# Patient Record
Sex: Male | Born: 1955 | Race: White | Hispanic: No | Marital: Married | Smoking: Former smoker
Health system: Southern US, Community
[De-identification: ages and names within clinical notes are randomized; demographics above are authoritative.]

## PROBLEM LIST (undated history)

## (undated) DIAGNOSIS — I10 Essential (primary) hypertension: Secondary | ICD-10-CM

## (undated) DIAGNOSIS — T8859XA Other complications of anesthesia, initial encounter: Secondary | ICD-10-CM

## (undated) DIAGNOSIS — E785 Hyperlipidemia, unspecified: Secondary | ICD-10-CM

## (undated) DIAGNOSIS — K219 Gastro-esophageal reflux disease without esophagitis: Secondary | ICD-10-CM

## (undated) DIAGNOSIS — J302 Other seasonal allergic rhinitis: Secondary | ICD-10-CM

## (undated) DIAGNOSIS — R7303 Prediabetes: Secondary | ICD-10-CM

## (undated) DIAGNOSIS — T4145XA Adverse effect of unspecified anesthetic, initial encounter: Secondary | ICD-10-CM

## (undated) HISTORY — PX: COLONOSCOPY W/ BIOPSIES AND POLYPECTOMY: SHX1376

## (undated) HISTORY — PX: FRACTURE SURGERY: SHX138

---
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
## 2013-06-10 ENCOUNTER — Inpatient Hospital Stay (HOSPITAL_COMMUNITY): Payer: BC Managed Care – PPO

## 2013-06-10 ENCOUNTER — Inpatient Hospital Stay (HOSPITAL_COMMUNITY)
Admission: AD | Admit: 2013-06-10 | Discharge: 2013-06-15 | DRG: 871 | Disposition: A | Discharge status: Home Health | Payer: BC Managed Care – PPO | Source: Ambulatory Visit | Attending: Internal Medicine | Admitting: Internal Medicine

## 2013-06-10 ENCOUNTER — Encounter (HOSPITAL_COMMUNITY): Payer: Self-pay | Admitting: Internal Medicine

## 2013-06-10 DIAGNOSIS — Z87891 Personal history of nicotine dependence: Secondary | ICD-10-CM

## 2013-06-10 DIAGNOSIS — Z833 Family history of diabetes mellitus: Secondary | ICD-10-CM

## 2013-06-10 DIAGNOSIS — I10 Essential (primary) hypertension: Secondary | ICD-10-CM

## 2013-06-10 DIAGNOSIS — K819 Cholecystitis, unspecified: Secondary | ICD-10-CM

## 2013-06-10 DIAGNOSIS — Z823 Family history of stroke: Secondary | ICD-10-CM

## 2013-06-10 DIAGNOSIS — K219 Gastro-esophageal reflux disease without esophagitis: Secondary | ICD-10-CM | POA: Diagnosis present

## 2013-06-10 DIAGNOSIS — K8000 Calculus of gallbladder with acute cholecystitis without obstruction: Secondary | ICD-10-CM | POA: Diagnosis present

## 2013-06-10 DIAGNOSIS — R7309 Other abnormal glucose: Secondary | ICD-10-CM | POA: Diagnosis present

## 2013-06-10 DIAGNOSIS — E785 Hyperlipidemia, unspecified: Secondary | ICD-10-CM

## 2013-06-10 DIAGNOSIS — I498 Other specified cardiac arrhythmias: Secondary | ICD-10-CM | POA: Diagnosis present

## 2013-06-10 DIAGNOSIS — K750 Abscess of liver: Secondary | ICD-10-CM

## 2013-06-10 DIAGNOSIS — A419 Sepsis, unspecified organism: Principal | ICD-10-CM

## 2013-06-10 DIAGNOSIS — E876 Hypokalemia: Secondary | ICD-10-CM | POA: Diagnosis present

## 2013-06-10 DIAGNOSIS — R000 Tachycardia, unspecified: Secondary | ICD-10-CM | POA: Diagnosis present

## 2013-06-10 HISTORY — DX: Essential (primary) hypertension: I10

## 2013-06-10 HISTORY — DX: Prediabetes: R73.03

## 2013-06-10 HISTORY — DX: Hyperlipidemia, unspecified: E78.5

## 2013-06-10 MED ORDER — HEPARIN SODIUM (PORCINE) 5000 UNIT/ML IJ SOLN: 5000.0000 [IU] | Freq: Three times a day (TID) | INTRAMUSCULAR | Status: DC

## 2013-06-10 MED ORDER — INSULIN ASPART 100 UNIT/ML ~~LOC~~ SOLN: 0.0000 [IU] | Freq: Four times a day (QID) | SUBCUTANEOUS | Status: DC

## 2013-06-10 MED ORDER — SODIUM CHLORIDE 0.9 % IJ SOLN
3.0000 mL | Freq: Two times a day (BID) | INTRAMUSCULAR | Status: DC
  Administered 2013-06-12 – 2013-06-14 (×3): 3 mL via INTRAVENOUS

## 2013-06-10 MED ORDER — SODIUM CHLORIDE 0.9 % IV SOLN
INTRAVENOUS | Status: DC
  Administered 2013-06-10 – 2013-06-13 (×6): via INTRAVENOUS

## 2013-06-10 MED ORDER — DEXTROSE 5 % IV SOLN
1.0000 g | INTRAVENOUS | Status: DC
  Administered 2013-06-10 – 2013-06-13 (×4): 1 g via INTRAVENOUS
  Filled 2013-06-10 (×5): qty 10

## 2013-06-10 MED ORDER — SODIUM CHLORIDE 0.9 % IV BOLUS (SEPSIS)
1000.0000 mL | Freq: Once | INTRAVENOUS | Status: AC
  Administered 2013-06-10: 1000 mL via INTRAVENOUS

## 2013-06-10 MED ORDER — ONDANSETRON HCL 4 MG/2ML IJ SOLN: 4.0000 mg | Freq: Four times a day (QID) | INTRAMUSCULAR | Status: DC | PRN

## 2013-06-10 MED ORDER — METRONIDAZOLE IN NACL 5-0.79 MG/ML-% IV SOLN
500.0000 mg | Freq: Three times a day (TID) | INTRAVENOUS | Status: DC
  Administered 2013-06-10 – 2013-06-14 (×11): 500 mg via INTRAVENOUS
  Filled 2013-06-10 (×13): qty 100

## 2013-06-10 MED ORDER — ONDANSETRON HCL 4 MG PO TABS: 4.0000 mg | ORAL_TABLET | Freq: Four times a day (QID) | ORAL | Status: DC | PRN

## 2013-06-10 MED ORDER — MORPHINE SULFATE 2 MG/ML IJ SOLN
2.0000 mg | INTRAMUSCULAR | Status: DC | PRN
  Administered 2013-06-11 – 2013-06-12 (×3): 2 mg via INTRAVENOUS
  Filled 2013-06-10 (×3): qty 1

## 2013-06-10 MED ORDER — PANTOPRAZOLE SODIUM 40 MG IV SOLR
40.0000 mg | INTRAVENOUS | Status: DC
  Administered 2013-06-10: 40 mg via INTRAVENOUS
  Filled 2013-06-10: qty 40

## 2013-06-10 NOTE — H&P (Signed)
[facility] History and Physical  Patient: Vincent Rios  ZOX:[id number]  DOB: [birth year]  DOS: the patient was seen and examined on 06/10/2013 PCP: Kirstie Peri, MD  Chief Complaint: Abdominal pain  HPI: Vincent Rios is a 58 y.o. male with Past medical history of Hypertension and dyslipidemia, Former smoker. The patient presented with complaints of right upper quadrant pain ongoing since last 2 days. He started having this pain during the day and progressively got worse. He initially felt this could be indigestion but then the pain was not improving and he started having episodes of nausea and fever and chills. He started having generalized weakness and tiredness. Earlier on Thursday morning the symptoms were worsening and he had one episode of black color bowel movement. This is he decided to go to his doctor. They performed initial workup which showed leukocytosis. He was sent for an ultrasound which showed thickened gallbladder with possible stone and hepatic lesion and was recommended MRI which showed possible hepatic abscess and conformed acute cholecystitis without any acute abnormality and therefore the patient was sent here for further workup. At the time of my evaluation patient mentions his symptoms have improved but not significantly he continues to remain nauseous and has acid reflux. He does not have any fever or chills. He had been passing gas but no bowel movements this morning. He denies any prior episodes or prior abdominal surgeries he denies any recent diarrhea or travel. He denies any change in his medication. He complains of some shortness of breath and he feels as the cause of abdominal pain limiting and taking deep breath. Pt denies any headache, cough, chest pain, palpitation, orthopnea, PND, burning urination, dizziness, pedal edema,  focal neurological deficit.   The patient is coming from home. And at her baseline independent for most of his  ADL.  Review of  Systems: as mentioned in the history of present illness.  A Comprehensive review of the other systems is negative.  Past Medical History  Diagnosis Date  . Hypertension   . Dyslipidemia    No past surgical history on file. Social History:  reports that he quit smoking about 30 years ago. His smoking use included Cigarettes. He has a 15 pack-year smoking history. He does not have any smokeless tobacco history on file. He reports that he drinks alcohol. His drug history is not on file.  No Known Allergies  Family History  Problem Relation Age of Onset  . Cancer Mother     breast  . Diabetes Mother   . Stroke Father     Prior to Admission medications   Medication Sig Start Date End Date Taking? Authorizing Provider  Coenzyme Q10 (CO Q-10) 200 MG CAPS Take by mouth.   Yes Historical Provider, MD  losartan (COZAAR) 50 MG tablet Take 100 mg by mouth daily.   Yes Historical Provider, MD  omega-3 acid ethyl esters (LOVAZA) 1 G capsule Take by mouth 2 (two) times daily.   Yes Historical Provider, MD  rosuvastatin (CRESTOR) 10 MG tablet Take 10 mg by mouth daily.   Yes Historical Provider, MD    Physical Exam: Filed Vitals:   06/10/13 1936 06/10/13 2156  BP: 133/93 146/92  Pulse: 146 135  Temp: 99.3 F (37.4 C) 99 F (37.2 C)  TempSrc: Oral Oral  Resp: 18 18  SpO2: 95% 95%    General: Alert, Awake and Oriented to Time, Place and Person. Appear in mild distress Eyes: PERRL ENT: Oral Mucosa clear moist. Neck:  no JVD Cardiovascular: S1 and S2 Present, no Murmur, Peripheral Pulses Present Respiratory: Bilateral Air entry equal and Decreased, Clear to Auscultation,  no Crackles,no wheezes Abdomen: Bowel Sound Present, Soft and Right upper quadrant mild tender, No guarding no rigidity, Murphy positive Skin: no Rash Extremities: no Pedal edema, no calf tenderness Neurologic: Grossly Unremarkable.  Labs on Admission:  CBC:  Recent Labs Lab 06/10/13 2250  WBC 26.3*   NEUTROABS 22.6*  HGB 17.4*  HCT 48.9  MCV 82.9  PLT 191    CMP     Component Value Date/Time   NA 132* 06/10/2013 2250   Radiological Exams on Admission: Dg Chest Port 1 View  06/10/2013   CLINICAL DATA:  Shortness of breath, abdominal pain and distention.  EXAM: PORTABLE CHEST - 1 VIEW  COMPARISON:  None.  FINDINGS: The lungs are well-aerated. Mild bibasilar opacities likely reflect atelectasis. There is no evidence of pleural effusion or pneumothorax.  The cardiomediastinal silhouette is within normal limits. No acute osseous abnormalities are seen.  IMPRESSION: Mild bibasilar airspace opacities likely reflect atelectasis; lungs otherwise clear.   Electronically Signed   By: Roanna RaiderJeffery  Chang M.D.   On: 06/10/2013 21:31    EKG: Independently reviewed. sinus tachycardia.  Assessment/Plan Principal Problem:   Cholecystitis Active Problems:   Hypertension   Dyslipidemia   Hepatic abscess   1. Cholecystitis Hepatic abscess The patient is presenting with complaints of abdominal pain in the right upper quadrant his LFTs are within normal limits. He had an MRI as an outpatient which showed possible hepatic abscess with acute cholecystitis. At present other sinus tachycardia the patient appears hemodynamically stable. Patient was discussed with on-call surgeon, We will be requesting imaging studies from the other facility, And a few not able to get them then patient may require CT scan with contrast of the abdomen tomorrow. We will obtain blood cultures, repeat labs. I would start him on IV ceftriaxone and IV Flagyl which would cover both cholecystitis as well as pyogenic abscess. Patient may require IR guidance drainage of the abscess. Patient is aggressively hydrated, IV pain medications IV Zofran.  2.Hypertension Blood pressure at present stable Continue home antihypertensive medications from tomorrow  3.Black color bowel movement We'll obtain Hemoccult, serial CBC, INR  normal Protonix every 12 hours  Consults: General surgery  DVT Prophylaxis: SCD Nutrition: n.P.O. Except sips of water and ice chips  Code Status: Full  Family Communication: Wife was present at bedside, opportunity was given to ask question and all questions were answered satisfactorily at the time of interview. Disposition: Admitted to inpatient in telemetry unit.  Author: Lynden OxfordPranav Mikeria Valin, MD [facility] Pager: [phone] 06/10/2013, 10:32 PM    If 7PM-7AM, please contact night-coverage [email or web address] Password [email or web address]

## 2013-06-11 ENCOUNTER — Inpatient Hospital Stay (HOSPITAL_COMMUNITY): Payer: BC Managed Care – PPO

## 2013-06-11 ENCOUNTER — Encounter (HOSPITAL_COMMUNITY): Payer: Self-pay | Admitting: Radiology

## 2013-06-11 DIAGNOSIS — K750 Abscess of liver: Secondary | ICD-10-CM

## 2013-06-11 DIAGNOSIS — A419 Sepsis, unspecified organism: Principal | ICD-10-CM

## 2013-06-11 DIAGNOSIS — E785 Hyperlipidemia, unspecified: Secondary | ICD-10-CM

## 2013-06-11 DIAGNOSIS — K819 Cholecystitis, unspecified: Secondary | ICD-10-CM

## 2013-06-11 DIAGNOSIS — R000 Tachycardia, unspecified: Secondary | ICD-10-CM | POA: Diagnosis present

## 2013-06-11 DIAGNOSIS — I498 Other specified cardiac arrhythmias: Secondary | ICD-10-CM

## 2013-06-11 DIAGNOSIS — I10 Essential (primary) hypertension: Secondary | ICD-10-CM

## 2013-06-11 LAB — CBC WITH DIFFERENTIAL/PLATELET
Basophils Absolute: 0 10*3/uL (ref 0.0–0.1)
Basophils Absolute: 0 10*3/uL (ref 0.0–0.1)
Basophils Relative: 0 % (ref 0–1)
Basophils Relative: 0 % (ref 0–1)
Eosinophils Absolute: 0 10*3/uL (ref 0.0–0.7)
Eosinophils Absolute: 0 10*3/uL (ref 0.0–0.7)
Eosinophils Relative: 0 % (ref 0–5)
Eosinophils Relative: 0 % (ref 0–5)
HCT: 48.9 % (ref 39.0–52.0)
HCT: 46.1 % (ref 39.0–52.0)
Hemoglobin: 17.4 g/dL — ABNORMAL HIGH (ref 13.0–17.0)
Hemoglobin: 16.2 g/dL (ref 13.0–17.0)
Lymphocytes Relative: 9 % — ABNORMAL LOW (ref 12–46)
Lymphocytes Relative: 6 % — ABNORMAL LOW (ref 12–46)
Lymphs Abs: 2.4 10*3/uL (ref 0.7–4.0)
Lymphs Abs: 1.4 10*3/uL (ref 0.7–4.0)
MCH: 29.5 pg (ref 26.0–34.0)
MCH: 28.9 pg (ref 26.0–34.0)
MCHC: 35.6 g/dL (ref 30.0–36.0)
MCHC: 35.1 g/dL (ref 30.0–36.0)
MCV: 82.9 fL (ref 78.0–100.0)
MCV: 82.2 fL (ref 78.0–100.0)
Monocytes Absolute: 1.3 10*3/uL — ABNORMAL HIGH (ref 0.1–1.0)
Monocytes Absolute: 1.4 10*3/uL — ABNORMAL HIGH (ref 0.1–1.0)
Monocytes Relative: 5 % (ref 3–12)
Monocytes Relative: 6 % (ref 3–12)
Neutro Abs: 22.6 10*3/uL — ABNORMAL HIGH (ref 1.7–7.7)
Neutro Abs: 19.7 10*3/uL — ABNORMAL HIGH (ref 1.7–7.7)
Neutrophils Relative %: 86 % — ABNORMAL HIGH (ref 43–77)
Neutrophils Relative %: 88 % — ABNORMAL HIGH (ref 43–77)
Platelets: 191 10*3/uL (ref 150–400)
Platelets: 172 10*3/uL (ref 150–400)
RBC: 5.9 MIL/uL — ABNORMAL HIGH (ref 4.22–5.81)
RBC: 5.61 MIL/uL (ref 4.22–5.81)
RDW: 13.7 % (ref 11.5–15.5)
RDW: 13.7 % (ref 11.5–15.5)
WBC: 26.3 10*3/uL — ABNORMAL HIGH (ref 4.0–10.5)
WBC: 22.5 10*3/uL — ABNORMAL HIGH (ref 4.0–10.5)

## 2013-06-11 LAB — COMPREHENSIVE METABOLIC PANEL
ALT: 19 U/L (ref 0–53)
ALT: 17 U/L (ref 0–53)
AST: 22 U/L (ref 0–37)
AST: 17 U/L (ref 0–37)
Albumin: 3.4 g/dL — ABNORMAL LOW (ref 3.5–5.2)
Albumin: 2.9 g/dL — ABNORMAL LOW (ref 3.5–5.2)
Alkaline Phosphatase: 94 U/L (ref 39–117)
Alkaline Phosphatase: 81 U/L (ref 39–117)
BUN: 15 mg/dL (ref 6–23)
BUN: 15 mg/dL (ref 6–23)
CO2: 21 mEq/L (ref 19–32)
CO2: 21 mEq/L (ref 19–32)
Calcium: 9.6 mg/dL (ref 8.4–10.5)
Calcium: 9 mg/dL (ref 8.4–10.5)
Chloride: 93 mEq/L — ABNORMAL LOW (ref 96–112)
Chloride: 99 mEq/L (ref 96–112)
Creatinine, Ser: 1.08 mg/dL (ref 0.50–1.35)
Creatinine, Ser: 1.02 mg/dL (ref 0.50–1.35)
GFR calc Af Amer: 86 mL/min — ABNORMAL LOW (ref >90)
GFR calc Af Amer: >90 mL/min (ref >90)
GFR calc non Af Amer: 74 mL/min — ABNORMAL LOW (ref >90)
GFR calc non Af Amer: 79 mL/min — ABNORMAL LOW (ref >90)
Glucose, Bld: 134 mg/dL — ABNORMAL HIGH (ref 70–99)
Glucose, Bld: 155 mg/dL — ABNORMAL HIGH (ref 70–99)
Potassium: 3.8 mEq/L (ref 3.7–5.3)
Potassium: 3.7 mEq/L (ref 3.7–5.3)
Sodium: 132 mEq/L — ABNORMAL LOW (ref 137–147)
Sodium: 136 mEq/L — ABNORMAL LOW (ref 137–147)
Total Bilirubin: 2.3 mg/dL — ABNORMAL HIGH (ref 0.3–1.2)
Total Bilirubin: 1.7 mg/dL — ABNORMAL HIGH (ref 0.3–1.2)
Total Protein: 7.7 g/dL (ref 6.0–8.3)
Total Protein: 6.8 g/dL (ref 6.0–8.3)

## 2013-06-11 LAB — TYPE AND SCREEN
ABO/RH(D): O POS
Antibody Screen: NEGATIVE

## 2013-06-11 LAB — MAGNESIUM: Magnesium: 1.8 mg/dL (ref 1.5–2.5)

## 2013-06-11 LAB — PHOSPHORUS: Phosphorus: 2.2 mg/dL — ABNORMAL LOW (ref 2.3–4.6)

## 2013-06-11 LAB — ABO/RH: ABO/RH(D): O POS

## 2013-06-11 LAB — PROTIME-INR
INR: 1.17 (ref 0.00–1.49)
INR: 1.3 (ref 0.00–1.49)
Prothrombin Time: 14.7 seconds (ref 11.6–15.2)
Prothrombin Time: 15.9 seconds — ABNORMAL HIGH (ref 11.6–15.2)

## 2013-06-11 LAB — APTT: aPTT: 34 seconds (ref 24–37)

## 2013-06-11 MED ORDER — PANTOPRAZOLE SODIUM 40 MG IV SOLR
40.0000 mg | Freq: Two times a day (BID) | INTRAVENOUS | Status: DC
  Administered 2013-06-11 – 2013-06-13 (×5): 40 mg via INTRAVENOUS
  Filled 2013-06-11 (×7): qty 40

## 2013-06-11 MED ORDER — FENTANYL CITRATE 0.05 MG/ML IJ SOLN
INTRAMUSCULAR | Status: AC
  Filled 2013-06-11: qty 4

## 2013-06-11 MED ORDER — FENTANYL CITRATE 0.05 MG/ML IJ SOLN
INTRAMUSCULAR | Status: AC | PRN
  Administered 2013-06-11 (×3): 50 ug via INTRAVENOUS

## 2013-06-11 MED ORDER — MIDAZOLAM HCL 2 MG/2ML IJ SOLN
INTRAMUSCULAR | Status: AC | PRN
  Administered 2013-06-11 (×2): 2 mg via INTRAVENOUS

## 2013-06-11 MED ORDER — FENTANYL CITRATE 0.05 MG/ML IJ SOLN
INTRAMUSCULAR | Status: AC
  Filled 2013-06-11: qty 2

## 2013-06-11 MED ORDER — CHLORHEXIDINE GLUCONATE 0.12 % MT SOLN
15.0000 mL | Freq: Two times a day (BID) | OROMUCOSAL | Status: DC
  Administered 2013-06-11 – 2013-06-13 (×5): 15 mL via OROMUCOSAL
  Filled 2013-06-11 (×4): qty 15

## 2013-06-11 MED ORDER — MIDAZOLAM HCL 2 MG/2ML IJ SOLN
INTRAMUSCULAR | Status: AC
  Filled 2013-06-11: qty 2

## 2013-06-11 MED ORDER — BIOTENE DRY MOUTH MT LIQD
15.0000 mL | Freq: Two times a day (BID) | OROMUCOSAL | Status: DC
  Administered 2013-06-11: 15 mL via OROMUCOSAL

## 2013-06-11 MED ORDER — SODIUM CHLORIDE 0.9 % IV BOLUS (SEPSIS)
500.0000 mL | Freq: Once | INTRAVENOUS | Status: AC
  Administered 2013-06-11: 500 mL via INTRAVENOUS

## 2013-06-11 MED ORDER — MIDAZOLAM HCL 2 MG/2ML IJ SOLN
INTRAMUSCULAR | Status: AC
  Filled 2013-06-11: qty 4

## 2013-06-11 MED ORDER — IOHEXOL 300 MG/ML  SOLN
50.0000 mL | Freq: Once | INTRAMUSCULAR | Status: AC | PRN
  Administered 2013-06-11: 10 mL

## 2013-06-11 NOTE — Progress Notes (Signed)
Chart reviewed.  Discussed with IR PA.   [facility] PROGRESS NOTE  Vincent Rios ZOX:[id number] DOB: 10/28/1955 DOA: 06/10/2013 PCP: Kirstie PeriSHAH,ASHISH, MD  Assessment/Plan:  Principal Problem:  acute Cholecystitis: for perc drain in IR today. Continue NPO, abx Active Problems:   Hypertension   Dyslipidemia   Hepatic abscess? May need drainage or further imaging. Defer to surgery and IR   Sepsis: no shock, but still fairly tachycardic after 1.3 liters saline.  Will bolus another 500 cc. Antihypertensives held   Sinus tachycardia secondary to above. Continue tele for now Reported dark stool: No further stools here and hemoglobin is stable. Hemoccult ordered. RN to collect.  Code Status:  full Family Communication:   Disposition Plan:  home  Consultants:  General surgery  IR  Procedures:     Antibiotics:  Ceftriaxone 4/2  Flagyl 4/2  HPI/Subjective: Feels a little better. Symptoms started 2 days ago. No nausea currently. No further dark stools.  Objective: Filed Vitals:   06/11/13 0855  BP:   Pulse:   Temp: 98.2 F (36.8 C)  Resp:     Intake/Output Summary (Last 24 hours) at 06/11/13 1037 Last data filed at 06/11/13 0634  Gross per 24 hour  Intake   1330 ml  Output    670 ml  Net    660 ml   Filed Weights   06/11/13 0500  Weight: 117.028 kg (258 lb)   Tele: sinus tachycardia rate 120  Exam:   General:  Alert, oriented. Diaphoretic.  Cardiovascular: Tachycardic. Regular  Respiratory: Regular rate rhythm without murmurs without rubs  Abdomen: Soft. Right upper quadrant tender.  Ext: No clubbing cyanosis or edema.  Basic Metabolic Panel:  Recent Labs Lab 06/10/13 2250 06/11/13 0450  NA 132* 136*  K 3.8 3.7  CL 93* 99  CO2 21 21  GLUCOSE 134* 155*  BUN 15 15  CREATININE 1.08 1.02  CALCIUM 9.6 9.0  MG 1.8  --   PHOS 2.2*  --    Liver Function Tests:  Recent Labs Lab 06/10/13 2250 06/11/13 0450  AST 22 17  ALT 19 17   ALKPHOS 94 81  BILITOT 2.3* 1.7*  PROT 7.7 6.8  ALBUMIN 3.4* 2.9*   No results found for this basename: LIPASE, AMYLASE,  in the last 168 hours No results found for this basename: AMMONIA,  in the last 168 hours CBC:  Recent Labs Lab 06/10/13 2250 06/11/13 0450  WBC 26.3* 22.5*  NEUTROABS 22.6* 19.7*  HGB 17.4* 16.2  HCT 48.9 46.1  MCV 82.9 82.2  PLT 191 172   Cardiac Enzymes: No results found for this basename: CKTOTAL, CKMB, CKMBINDEX, TROPONINI,  in the last 168 hours BNP (last 3 results) No results found for this basename: PROBNP,  in the last 8760 hours CBG: No results found for this basename: GLUCAP,  in the last 168 hours  No results found for this or any previous visit (from the past 240 hour(s)).   Studies: Dg Chest Port 1 View  06/10/2013   CLINICAL DATA:  Shortness of breath, abdominal pain and distention.  EXAM: PORTABLE CHEST - 1 VIEW  COMPARISON:  None.  FINDINGS: The lungs are well-aerated. Mild bibasilar opacities likely reflect atelectasis. There is no evidence of pleural effusion or pneumothorax.  The cardiomediastinal silhouette is within normal limits. No acute osseous abnormalities are seen.  IMPRESSION: Mild bibasilar airspace opacities likely reflect atelectasis; lungs otherwise clear.   Electronically Signed   By: Beryle BeamsJeffery  Chang M.D.  On: 06/10/2013 21:31    Scheduled Meds: . antiseptic oral rinse  15 mL Mouth Rinse q12n4p  . cefTRIAXone (ROCEPHIN)  IV  1 g Intravenous Q24H  . chlorhexidine  15 mL Mouth Rinse BID  . metronidazole  500 mg Intravenous Q8H  . pantoprazole (PROTONIX) IV  40 mg Intravenous Q12H  . sodium chloride  3 mL Intravenous Q12H   Continuous Infusions: . sodium chloride 125 mL/hr at 06/10/13 2330    Time spent: 35 minutes  Jaylean Buenaventura L  [facility] Pager [phone]. If 7PM-7AM, please contact night-coverage at [email or web address], password [facility] 06/11/2013, 10:37 AM  LOS: 1 day

## 2013-06-11 NOTE — H&P (Signed)
Chief Complaint: "Abdominal pain and fevers." Referring Physician: CCS HPI: Vincent Rios is an 58 y.o. male who presented with c/o RUQ abdominal pain x 2 days and fever. Recent imaging revealed acute cholecystitis, images of [place] and MRI were reviewed today by Dr. Annamaria Boots and IR received request for image guided percutaneous cholecystostomy tube placement. He denies any chest pain, shortness of breath or palpitations. He denies any active signs of bleeding or any further black stool or excessive bruising. He admits to fever and chills. The patient denies any history of sleep apnea or chronic oxygen use. He has previously tolerated sedation without complications.   Past Medical History:  Past Medical History  Diagnosis Date  . Hypertension   . Dyslipidemia   . Borderline diabetes     Past Surgical History:  Past Surgical History  Procedure Laterality Date  . Tonsillectomy  1962    Family History:  Family History  Problem Relation Age of Onset  . Cancer Mother     breast  . Diabetes Mother   . Stroke Father     Social History:  reports that he quit smoking about 30 years ago. His smoking use included Cigarettes. He has a 15 pack-year smoking history. He has never used smokeless tobacco. He reports that he drinks alcohol. His drug history is not on file.  Allergies: No Known Allergies  Medications:   Medication List    ASK your doctor about these medications       Co Q-10 200 MG Caps  Take 400 mg by mouth daily.     losartan 50 MG tablet  Commonly known as:  COZAAR  Take 100 mg by mouth daily.     omega-3 acid ethyl esters 1 G capsule  Commonly known as:  LOVAZA  Take 1 g by mouth 2 (two) times daily.     rosuvastatin 5 MG tablet  Commonly known as:  CRESTOR  Take 5 mg by mouth daily.       Please HPI for pertinent positives, otherwise complete 10 system ROS negative.  Physical Exam: BP 159/91  Pulse 113  Temp(Src) 98.2 F (36.8 C) (Oral)  Resp 18  Ht 6' 2"   (1.88 m)  Wt 258 lb (117.028 kg)  BMI 33.11 kg/m2  SpO2 95% Body mass index is 33.11 kg/(m^2).  General Appearance:  Alert, cooperative, no distress, diaphoretic  Head:  Normocephalic, without obvious abnormality, atraumatic  Neck: Supple, symmetrical, trachea midline  Lungs:   Clear to auscultation bilaterally, no w/r/r, respirations unlabored without use of accessory muscles.  Chest Wall:  No tenderness or deformity  Heart:  Tachycardic regular rhythm, S1, S2 normal, no murmur, rub or gallop.  Abdomen:   Soft, RUQ tenderness, non distended, (+) BS  Extremities: Extremities normal, atraumatic, no cyanosis or edema  Pulses: 2+ and symmetric  Neurologic: Normal affect, no gross deficits.   Results for orders placed during the hospital encounter of 06/10/13 (from the past 48 hour(s))  COMPREHENSIVE METABOLIC PANEL     Status: Abnormal   Collection Time    06/10/13 10:50 PM      Result Value Ref Range   Sodium 132 (*) 137 - 147 mEq/L   Potassium 3.8  3.7 - 5.3 mEq/L   Chloride 93 (*) 96 - 112 mEq/L   CO2 21  19 - 32 mEq/L   Glucose, Bld 134 (*) 70 - 99 mg/dL   BUN 15  6 - 23 mg/dL   Creatinine, Ser 1.08  0.50 -  1.35 mg/dL   Calcium 9.6  8.4 - 10.5 mg/dL   Total Protein 7.7  6.0 - 8.3 g/dL   Albumin 3.4 (*) 3.5 - 5.2 g/dL   AST 22  0 - 37 U/L   ALT 19  0 - 53 U/L   Alkaline Phosphatase 94  39 - 117 U/L   Total Bilirubin 2.3 (*) 0.3 - 1.2 mg/dL   GFR calc non Af Amer 74 (*) >90 mL/min   GFR calc Af Amer 86 (*) >90 mL/min   Comment: (NOTE)     The eGFR has been calculated using the CKD EPI equation.     This calculation has not been validated in all clinical situations.     eGFR's persistently <90 mL/min signify possible Chronic Kidney     Disease.  MAGNESIUM     Status: None   Collection Time    06/10/13 10:50 PM      Result Value Ref Range   Magnesium 1.8  1.5 - 2.5 mg/dL  PHOSPHORUS     Status: Abnormal   Collection Time    06/10/13 10:50 PM      Result Value Ref Range    Phosphorus 2.2 (*) 2.3 - 4.6 mg/dL  CBC WITH DIFFERENTIAL     Status: Abnormal   Collection Time    06/10/13 10:50 PM      Result Value Ref Range   WBC 26.3 (*) 4.0 - 10.5 K/uL   Comment: WHITE COUNT CONFIRMED ON SMEAR   RBC 5.90 (*) 4.22 - 5.81 MIL/uL   Hemoglobin 17.4 (*) 13.0 - 17.0 g/dL   HCT 48.9  39.0 - 52.0 %   MCV 82.9  78.0 - 100.0 fL   MCH 29.5  26.0 - 34.0 pg   MCHC 35.6  30.0 - 36.0 g/dL   RDW 13.7  11.5 - 15.5 %   Platelets 191  150 - 400 K/uL   Neutrophils Relative % 86 (*) 43 - 77 %   Lymphocytes Relative 9 (*) 12 - 46 %   Monocytes Relative 5  3 - 12 %   Eosinophils Relative 0  0 - 5 %   Basophils Relative 0  0 - 1 %   Neutro Abs 22.6 (*) 1.7 - 7.7 K/uL   Lymphs Abs 2.4  0.7 - 4.0 K/uL   Monocytes Absolute 1.3 (*) 0.1 - 1.0 K/uL   Eosinophils Absolute 0.0  0.0 - 0.7 K/uL   Basophils Absolute 0.0  0.0 - 0.1 K/uL   Smear Review MORPHOLOGY UNREMARKABLE    APTT     Status: None   Collection Time    06/10/13 10:50 PM      Result Value Ref Range   aPTT 34  24 - 37 seconds  PROTIME-INR     Status: None   Collection Time    06/10/13 10:50 PM      Result Value Ref Range   Prothrombin Time 14.7  11.6 - 15.2 seconds   INR 1.17  0.00 - 1.49  CBC WITH DIFFERENTIAL     Status: Abnormal   Collection Time    06/11/13  4:50 AM      Result Value Ref Range   WBC 22.5 (*) 4.0 - 10.5 K/uL   RBC 5.61  4.22 - 5.81 MIL/uL   Hemoglobin 16.2  13.0 - 17.0 g/dL   HCT 46.1  39.0 - 52.0 %   MCV 82.2  78.0 - 100.0 fL   MCH 28.9  26.0 -  34.0 pg   MCHC 35.1  30.0 - 36.0 g/dL   RDW 13.7  11.5 - 15.5 %   Platelets 172  150 - 400 K/uL   Neutrophils Relative % 88 (*) 43 - 77 %   Lymphocytes Relative 6 (*) 12 - 46 %   Monocytes Relative 6  3 - 12 %   Eosinophils Relative 0  0 - 5 %   Basophils Relative 0  0 - 1 %   Neutro Abs 19.7 (*) 1.7 - 7.7 K/uL   Lymphs Abs 1.4  0.7 - 4.0 K/uL   Monocytes Absolute 1.4 (*) 0.1 - 1.0 K/uL   Eosinophils Absolute 0.0  0.0 - 0.7 K/uL    Basophils Absolute 0.0  0.0 - 0.1 K/uL   WBC Morphology ATYPICAL LYMPHOCYTES    COMPREHENSIVE METABOLIC PANEL     Status: Abnormal   Collection Time    06/11/13  4:50 AM      Result Value Ref Range   Sodium 136 (*) 137 - 147 mEq/L   Potassium 3.7  3.7 - 5.3 mEq/L   Chloride 99  96 - 112 mEq/L   CO2 21  19 - 32 mEq/L   Glucose, Bld 155 (*) 70 - 99 mg/dL   BUN 15  6 - 23 mg/dL   Creatinine, Ser 1.02  0.50 - 1.35 mg/dL   Calcium 9.0  8.4 - 10.5 mg/dL   Total Protein 6.8  6.0 - 8.3 g/dL   Albumin 2.9 (*) 3.5 - 5.2 g/dL   AST 17  0 - 37 U/L   ALT 17  0 - 53 U/L   Alkaline Phosphatase 81  39 - 117 U/L   Total Bilirubin 1.7 (*) 0.3 - 1.2 mg/dL   GFR calc non Af Amer 79 (*) >90 mL/min   GFR calc Af Amer >90  >90 mL/min   Comment: (NOTE)     The eGFR has been calculated using the CKD EPI equation.     This calculation has not been validated in all clinical situations.     eGFR's persistently <90 mL/min signify possible Chronic Kidney     Disease.  PROTIME-INR     Status: Abnormal   Collection Time    06/11/13  4:50 AM      Result Value Ref Range   Prothrombin Time 15.9 (*) 11.6 - 15.2 seconds   INR 1.30  0.00 - 1.49  TYPE AND SCREEN     Status: None   Collection Time    06/11/13  4:50 AM      Result Value Ref Range   ABO/RH(D) O POS     Antibody Screen NEG     Sample Expiration 06/14/2013    ABO/RH     Status: None   Collection Time    06/11/13  4:50 AM      Result Value Ref Range   ABO/RH(D) O POS     Dg Chest Port 1 View  06/10/2013   CLINICAL DATA:  Shortness of breath, abdominal pain and distention.  EXAM: PORTABLE CHEST - 1 VIEW  COMPARISON:  None.  FINDINGS: The lungs are well-aerated. Mild bibasilar opacities likely reflect atelectasis. There is no evidence of pleural effusion or pneumothorax.  The cardiomediastinal silhouette is within normal limits. No acute osseous abnormalities are seen.  IMPRESSION: Mild bibasilar airspace opacities likely reflect atelectasis; lungs  otherwise clear.   Electronically Signed   By: Garald Balding M.D.   On: 06/10/2013 21:31  Assessment/Plan Acute cholecystitis on IV flagyl and rocephin.  ? Hepatic collections, per Dr. Annamaria Boots no intervention needed at this time.  Black colored stool, H/H stable. Request for image guided percutaneous cholecystostomy tube placement. Patient has been NPO, no blood thinners, labs and images reviewed. Risks and Benefits discussed with the patient. All of the patient's questions were answered, patient is agreeable to proceed. Consent signed and in chart.   Tsosie Billing D PA-C 06/11/2013, 11:46 AM

## 2013-06-11 NOTE — Procedures (Signed)
Successful 4210fr cholecystostomy No comp Stable 100cc infected bile aspirated Gs/gx sent

## 2013-06-11 NOTE — Consult Note (Signed)
Reason for Consult:  Abdominal pain and gallstones.   Vincent Rios is an 58 y.o. male.  HPI: The patient was transferred from [facility] with signs of sepsis after an ultrasound and MRI demonstrates possible intrahepatic abscess associated with acute cholecystitis.  Because of sepsis the patient was transferred to [facility].  Unfortunately, none of the patient's radiological studies were sent with him and could not be reviewed personally.  The report has been reviewed.  The patient remains tachycardic, but  His fever has significantly improved.  Past Medical History  Diagnosis Date  . Hypertension   . Dyslipidemia   . Borderline diabetes     Past Surgical History  Procedure Laterality Date  . Tonsillectomy  1962    Family History  Problem Relation Age of Onset  . Cancer Mother     breast  . Diabetes Mother   . Stroke Father     Social History:  reports that he quit smoking about 30 years ago. His smoking use included Cigarettes. He has a 15 pack-year smoking history. He has never used smokeless tobacco. He reports that he drinks alcohol. His drug history is not on file.  Allergies: No Known Allergies I have reviewed the patient's current medications. Medications:   Results for orders placed during the hospital encounter of 06/10/13 (from the past 48 hour(s))  COMPREHENSIVE METABOLIC PANEL     Status: Abnormal   Collection Time    06/10/13 10:50 PM      Result Value Ref Range   Sodium 132 (*) 137 - 147 mEq/L   Potassium 3.8  3.7 - 5.3 mEq/L   Chloride 93 (*) 96 - 112 mEq/L   CO2 21  19 - 32 mEq/L   Glucose, Bld 134 (*) 70 - 99 mg/dL   BUN 15  6 - 23 mg/dL   Creatinine, Ser 1.08  0.50 - 1.35 mg/dL   Calcium 9.6  8.4 - 10.5 mg/dL   Total Protein 7.7  6.0 - 8.3 g/dL   Albumin 3.4 (*) 3.5 - 5.2 g/dL   AST 22  0 - 37 U/L   ALT 19  0 - 53 U/L   Alkaline Phosphatase 94  39 - 117 U/L   Total Bilirubin 2.3 (*) 0.3 - 1.2 mg/dL   GFR calc non Af Amer 74 (*) >90 mL/min   GFR calc Af Amer 86 (*) >90 mL/min   Comment: (NOTE)     The eGFR has been calculated using the CKD EPI equation.     This calculation has not been validated in all clinical situations.     eGFR's persistently <90 mL/min signify possible Chronic Kidney     Disease.  MAGNESIUM     Status: None   Collection Time    06/10/13 10:50 PM      Result Value Ref Range   Magnesium 1.8  1.5 - 2.5 mg/dL  PHOSPHORUS     Status: Abnormal   Collection Time    06/10/13 10:50 PM      Result Value Ref Range   Phosphorus 2.2 (*) 2.3 - 4.6 mg/dL  CBC WITH DIFFERENTIAL     Status: Abnormal   Collection Time    06/10/13 10:50 PM      Result Value Ref Range   WBC 26.3 (*) 4.0 - 10.5 K/uL   Comment: WHITE COUNT CONFIRMED ON SMEAR   RBC 5.90 (*) 4.22 - 5.81 MIL/uL   Hemoglobin 17.4 (*) 13.0 - 17.0 g/dL   HCT  48.9  39.0 - 52.0 %   MCV 82.9  78.0 - 100.0 fL   MCH 29.5  26.0 - 34.0 pg   MCHC 35.6  30.0 - 36.0 g/dL   RDW 13.7  11.5 - 15.5 %   Platelets 191  150 - 400 K/uL   Neutrophils Relative % 86 (*) 43 - 77 %   Lymphocytes Relative 9 (*) 12 - 46 %   Monocytes Relative 5  3 - 12 %   Eosinophils Relative 0  0 - 5 %   Basophils Relative 0  0 - 1 %   Neutro Abs 22.6 (*) 1.7 - 7.7 K/uL   Lymphs Abs 2.4  0.7 - 4.0 K/uL   Monocytes Absolute 1.3 (*) 0.1 - 1.0 K/uL   Eosinophils Absolute 0.0  0.0 - 0.7 K/uL   Basophils Absolute 0.0  0.0 - 0.1 K/uL   Smear Review MORPHOLOGY UNREMARKABLE    APTT     Status: None   Collection Time    06/10/13 10:50 PM      Result Value Ref Range   aPTT 34  24 - 37 seconds  PROTIME-INR     Status: None   Collection Time    06/10/13 10:50 PM      Result Value Ref Range   Prothrombin Time 14.7  11.6 - 15.2 seconds   INR 1.17  0.00 - 1.49  CBC WITH DIFFERENTIAL     Status: Abnormal   Collection Time    06/11/13  4:50 AM      Result Value Ref Range   WBC 22.5 (*) 4.0 - 10.5 K/uL   RBC 5.61  4.22 - 5.81 MIL/uL   Hemoglobin 16.2  13.0 - 17.0 g/dL   HCT 46.1  39.0 -  52.0 %   MCV 82.2  78.0 - 100.0 fL   MCH 28.9  26.0 - 34.0 pg   MCHC 35.1  30.0 - 36.0 g/dL   RDW 13.7  11.5 - 15.5 %   Platelets 172  150 - 400 K/uL   Neutrophils Relative % 88 (*) 43 - 77 %   Lymphocytes Relative 6 (*) 12 - 46 %   Monocytes Relative 6  3 - 12 %   Eosinophils Relative 0  0 - 5 %   Basophils Relative 0  0 - 1 %   Neutro Abs 19.7 (*) 1.7 - 7.7 K/uL   Lymphs Abs 1.4  0.7 - 4.0 K/uL   Monocytes Absolute 1.4 (*) 0.1 - 1.0 K/uL   Eosinophils Absolute 0.0  0.0 - 0.7 K/uL   Basophils Absolute 0.0  0.0 - 0.1 K/uL   WBC Morphology ATYPICAL LYMPHOCYTES    COMPREHENSIVE METABOLIC PANEL     Status: Abnormal   Collection Time    06/11/13  4:50 AM      Result Value Ref Range   Sodium 136 (*) 137 - 147 mEq/L   Potassium 3.7  3.7 - 5.3 mEq/L   Chloride 99  96 - 112 mEq/L   CO2 21  19 - 32 mEq/L   Glucose, Bld 155 (*) 70 - 99 mg/dL   BUN 15  6 - 23 mg/dL   Creatinine, Ser 1.02  0.50 - 1.35 mg/dL   Calcium 9.0  8.4 - 10.5 mg/dL   Total Protein 6.8  6.0 - 8.3 g/dL   Albumin 2.9 (*) 3.5 - 5.2 g/dL   AST 17  0 - 37 U/L   ALT 17  0 -  53 U/L   Alkaline Phosphatase 81  39 - 117 U/L   Total Bilirubin 1.7 (*) 0.3 - 1.2 mg/dL   GFR calc non Af Amer 79 (*) >90 mL/min   GFR calc Af Amer >90  >90 mL/min   Comment: (NOTE)     The eGFR has been calculated using the CKD EPI equation.     This calculation has not been validated in all clinical situations.     eGFR's persistently <90 mL/min signify possible Chronic Kidney     Disease.  PROTIME-INR     Status: Abnormal   Collection Time    06/11/13  4:50 AM      Result Value Ref Range   Prothrombin Time 15.9 (*) 11.6 - 15.2 seconds   INR 1.30  0.00 - 1.49  TYPE AND SCREEN     Status: None   Collection Time    06/11/13  4:50 AM      Result Value Ref Range   ABO/RH(D) O POS     Antibody Screen NEG     Sample Expiration 06/14/2013    ABO/RH     Status: None   Collection Time    06/11/13  4:50 AM      Result Value Ref Range    ABO/RH(D) O POS      Dg Chest Port 1 View  06/10/2013   CLINICAL DATA:  Shortness of breath, abdominal pain and distention.  EXAM: PORTABLE CHEST - 1 VIEW  COMPARISON:  None.  FINDINGS: The lungs are well-aerated. Mild bibasilar opacities likely reflect atelectasis. There is no evidence of pleural effusion or pneumothorax.  The cardiomediastinal silhouette is within normal limits. No acute osseous abnormalities are seen.  IMPRESSION: Mild bibasilar airspace opacities likely reflect atelectasis; lungs otherwise clear.   Electronically Signed   By: Garald Balding M.D.   On: 06/10/2013 21:31    Review of Systems  Constitutional: Positive for fever and chills.  HENT: Negative.   Eyes: Negative.   Respiratory: Negative.   Cardiovascular: Negative.   Gastrointestinal: Positive for abdominal pain.       Poor appetite  Skin: Negative.   Neurological: Positive for weakness.  Psychiatric/Behavioral: Negative.    Blood pressure 159/91, pulse 113, temperature 98.2 F (36.8 C), temperature source Oral, resp. rate 18, height 6' 2"  (1.88 m), weight 117.028 kg (258 lb), SpO2 95.00%. Physical Exam  Vitals reviewed. Constitutional: He is oriented to person, place, and time. He appears well-developed and well-nourished.  HENT:  Head: Normocephalic and atraumatic.  Eyes: Conjunctivae and EOM are normal. Pupils are equal, round, and reactive to light.  Neck: Normal range of motion. Neck supple.  Cardiovascular: Regular rhythm, normal heart sounds and normal pulses.  Tachycardia present.   Respiratory: Effort normal and breath sounds normal.  GI: Soft. Bowel sounds are normal. There is tenderness in the right upper quadrant and epigastric area.    Musculoskeletal: Normal range of motion.  Neurological: He is alert and oriented to person, place, and time. He has normal reflexes.  Skin: Skin is warm and dry.    Assessment/Plan: Acute cholecystitis, cholelithiasis, possible liver abscess..  At this  rate the patient needs likely percutaneous drainage of his liver abscess and possibly the GB once adequate radiological studies have been reviewed.  Antibiotic seeed to have dontrolled the situation.  Gwenyth Ober 06/11/2013, 9:55 AM

## 2013-06-11 NOTE — Progress Notes (Signed)
Utilization review completed. Karynn Deblasi, RN, BSN. 

## 2013-06-12 DIAGNOSIS — R000 Tachycardia, unspecified: Secondary | ICD-10-CM

## 2013-06-12 DIAGNOSIS — R109 Unspecified abdominal pain: Secondary | ICD-10-CM

## 2013-06-12 LAB — CBC
HCT: 40.4 % (ref 39.0–52.0)
Hemoglobin: 13.7 g/dL (ref 13.0–17.0)
MCH: 28.8 pg (ref 26.0–34.0)
MCHC: 33.9 g/dL (ref 30.0–36.0)
MCV: 84.9 fL (ref 78.0–100.0)
Platelets: 151 10*3/uL (ref 150–400)
RBC: 4.76 MIL/uL (ref 4.22–5.81)
RDW: 14.1 % (ref 11.5–15.5)
WBC: 12 10*3/uL — ABNORMAL HIGH (ref 4.0–10.5)

## 2013-06-12 LAB — COMPREHENSIVE METABOLIC PANEL
ALT: 19 U/L (ref 0–53)
AST: 22 U/L (ref 0–37)
Albumin: 2.4 g/dL — ABNORMAL LOW (ref 3.5–5.2)
Alkaline Phosphatase: 83 U/L (ref 39–117)
BUN: 20 mg/dL (ref 6–23)
CO2: 24 mEq/L (ref 19–32)
Calcium: 8.8 mg/dL (ref 8.4–10.5)
Chloride: 102 mEq/L (ref 96–112)
Creatinine, Ser: 1.13 mg/dL (ref 0.50–1.35)
GFR calc Af Amer: 81 mL/min — ABNORMAL LOW (ref >90)
GFR calc non Af Amer: 70 mL/min — ABNORMAL LOW (ref >90)
Glucose, Bld: 106 mg/dL — ABNORMAL HIGH (ref 70–99)
Potassium: 3.7 mEq/L (ref 3.7–5.3)
Sodium: 140 mEq/L (ref 137–147)
Total Bilirubin: 1.1 mg/dL (ref 0.3–1.2)
Total Protein: 6.2 g/dL (ref 6.0–8.3)

## 2013-06-12 MED ORDER — ATORVASTATIN CALCIUM 10 MG PO TABS
10.0000 mg | ORAL_TABLET | Freq: Every day | ORAL | Status: DC
  Administered 2013-06-12 – 2013-06-14 (×3): 10 mg via ORAL
  Filled 2013-06-12 (×5): qty 1

## 2013-06-12 MED ORDER — OMEGA-3-ACID ETHYL ESTERS 1 G PO CAPS
1.0000 g | ORAL_CAPSULE | Freq: Two times a day (BID) | ORAL | Status: DC
  Administered 2013-06-12 – 2013-06-15 (×7): 1 g via ORAL
  Filled 2013-06-12 (×10): qty 1

## 2013-06-12 MED ORDER — LOSARTAN POTASSIUM 50 MG PO TABS
100.0000 mg | ORAL_TABLET | Freq: Every day | ORAL | Status: DC
  Administered 2013-06-12 – 2013-06-15 (×4): 100 mg via ORAL
  Filled 2013-06-12 (×4): qty 2

## 2013-06-12 NOTE — Progress Notes (Signed)
PROGRESS NOTE  Vincent Rios LKG:[id number] DOB: Aug 01, 1955 DOA: 06/10/2013 PCP: Kirstie Peri, MD  Assessment/Plan:    acute Cholecystitis: s/p perc drain in IR today. Abx, cultures pending    Hypertension: resume home meds once BP elevated   Dyslipidemia: resume home meds   Hepatic abscess: Defer to surgery and IR- no intervention needed at this time per Dr. Miles Costain   Sepsis: no shock, resolved   Sinus tachycardia secondary to above. Continue tele for now Reported dark stool: No further stools here and hemoglobin is stable. Hemoccult ordered. RN to collect. Leukocytosis: decreasing   Code Status:  full Family Communication:  Wife at bedside Disposition Plan:  home  Consultants:  General surgery  IR  Procedures:     Antibiotics:  Ceftriaxone 4/2  Flagyl 4/2  HPI/Subjective: Asking about going home  Objective: Filed Vitals:   06/12/13 0521  BP: 124/73  Pulse: 81  Temp: 99.5 F (37.5 C)  Resp: 18    Intake/Output Summary (Last 24 hours) at 06/12/13 0954 Last data filed at 06/12/13 0900  Gross per 24 hour  Intake   1309 ml  Output   1330 ml  Net    -21 ml   Filed Weights   06/11/13 0500  Weight: 117.028 kg (258 lb)   Tele: sinus tachycardia rate 120  Exam:   General:  Alert, oriented. Diaphoretic.  Cardiovascular: Tachycardic. Regular  Respiratory: Regular rate rhythm without murmurs without rubs  Abdomen: Soft. Right upper quadrant tender.  Ext: No clubbing cyanosis or edema.  Basic Metabolic Panel:  Recent Labs Lab 06/10/13 2250 06/11/13 0450 06/12/13 0405  NA 132* 136* 140  K 3.8 3.7 3.7  CL 93* 99 102  CO2 21 21 24   GLUCOSE 134* 155* 106*  BUN 15 15 20   CREATININE 1.08 1.02 1.13  CALCIUM 9.6 9.0 8.8  MG 1.8  --   --   PHOS 2.2*  --   --    Liver Function Tests:  Recent Labs Lab 06/10/13 2250 06/11/13 0450 06/12/13 0405  AST 22 17 22   ALT 19 17 19   ALKPHOS 94 81 83  BILITOT 2.3* 1.7* 1.1  PROT 7.7 6.8 6.2    ALBUMIN 3.4* 2.9* 2.4*   No results found for this basename: LIPASE, AMYLASE,  in the last 168 hours No results found for this basename: AMMONIA,  in the last 168 hours CBC:  Recent Labs Lab 06/10/13 2250 06/11/13 0450 06/12/13 0405  WBC 26.3* 22.5* 12.0*  NEUTROABS 22.6* 19.7*  --   HGB 17.4* 16.2 13.7  HCT 48.9 46.1 40.4  MCV 82.9 82.2 84.9  PLT 191 172 151   Cardiac Enzymes: No results found for this basename: CKTOTAL, CKMB, CKMBINDEX, TROPONINI,  in the last 168 hours BNP (last 3 results) No results found for this basename: PROBNP,  in the last 8760 hours CBG: No results found for this basename: GLUCAP,  in the last 168 hours  Recent Results (from the past 240 hour(s))  CULTURE, ROUTINE-ABSCESS     Status: None   Collection Time    06/11/13  1:23 PM      Result Value Ref Range Status   Specimen Description ABSCESS GALLBLADDER   Final   Special Requests GALLBLADDER   Final   Gram Stain     Final   Value: NO WBC SEEN     NO SQUAMOUS EPITHELIAL CELLS SEEN     FEW GRAM POSITIVE RODS     [facility]formed  at [facility]   Culture     Final   Value: Culture reincubated for better growth     Performed at [facility]   Report Status PENDING   Incomplete  ANAEROBIC CULTURE     Status: None   Collection Time    06/11/13  1:23 PM      Result Value Ref Range Status   Specimen Description ABSCESS GALLBLADDER   Final   Special Requests GALLBLADDER   Final   Gram Stain     Final   Value: NO WBC SEEN     NO SQUAMOUS EPITHELIAL CELLS SEEN     FEW GRAM POSITIVE RODS     Performed at [facility]   Culture PENDING   Incomplete   Report Status PENDING   Incomplete     Studies: Ir Perc Cholecystostomy  06/11/2013   CLINICAL DATA:  ACUTE CHOLECYSTITIS, RIGHT UPPER QUADRANT PAIN, POSSIBLE SMALL SECONDARY HEPATIC ABSCESSES  EXAM: ULTRASOUND AND FLUOROSCOPIC TRANSHEPATIC CHOLECYSTOSTOMY  Date:  4/3/20154/05/2013 1:12 PM  Radiologist:  Judie PetitM. Ruel Favorsrevor Shick, MD   Guidance:  Ultrasound and fluoroscopic  FLUOROSCOPY TIME:  1 min  MEDICATIONS AND MEDICAL HISTORY: Patient is already receiving antibiotics currently, 4 mg versus a, 100 mcg fentanyl  ANESTHESIA/SEDATION: 15 min  CONTRAST:  10mL OMNIPAQUE IOHEXOL 300 MG/ML  SOLN  COMPLICATIONS: No immediate  PROCEDURE: Informed consent was obtained from the patient following explanation of the procedure, risks, benefits and alternatives. The patient understands, agrees and consents for the procedure. All questions were addressed. A time out was performed.  Maximal barrier sterile technique utilized including caps, mask, sterile gowns, sterile gloves, large sterile drape, hand hygiene, and Betadine.  Previous imaging reviewed. Preliminary ultrasound performed of the gallbladder in the right upper quadrant. Overlying skin was marked. Under sterile conditions and local anesthesia, a 22 gauge access needle was advanced under ultrasound through a transhepatic windows into the gallbladder lumen. Needle position confirmed with ultrasound. There was return of malodorous exudative bile. Sample sent for Gram stain and culture. Guidewire inserted followed by transitional dilator. Amplatz guidewire exchange performed. Tract dilatation performed to advance a 10 French catheter. Retention loop formed in the gallbladder lumen. 100 cc bile aspirated. Gallbladder was decompressed. Contrast injection confirms position. Catheter secured with a Prolene suture and connected to gravity drainage bag. No immediate complication.  IMPRESSION: Successful fluoroscopic and ultrasound-guided 10 French transhepatic cholecystostomy   Electronically Signed   By: Ruel Favorsrevor  Shick M.D.   On: 06/11/2013 13:22   Dg Chest Port 1 View  06/10/2013   CLINICAL DATA:  Shortness of breath, abdominal pain and distention.  EXAM: PORTABLE CHEST - 1 VIEW  COMPARISON:  None.  FINDINGS: The lungs are well-aerated. Mild bibasilar opacities likely reflect atelectasis. There is no  evidence of pleural effusion or pneumothorax.  The cardiomediastinal silhouette is within normal limits. No acute osseous abnormalities are seen.  IMPRESSION: Mild bibasilar airspace opacities likely reflect atelectasis; lungs otherwise clear.   Electronically Signed   By: Roanna RaiderJeffery  Chang M.D.   On: 06/10/2013 21:31    Scheduled Meds: . antiseptic oral rinse  15 mL Mouth Rinse q12n4p  . cefTRIAXone (ROCEPHIN)  IV  1 g Intravenous Q24H  . chlorhexidine  15 mL Mouth Rinse BID  . metronidazole  500 mg Intravenous Q8H  . pantoprazole (PROTONIX) IV  40 mg Intravenous Q12H  . sodium chloride  3 mL Intravenous Q12H   Continuous Infusions: . sodium chloride 125 mL/hr at 06/11/13 1828  Time spent: 35 minutes  Marlin Canary  [facility] Pager [phone]. If 7PM-7AM, please contact night-coverage at [email or web address], password [facility] 06/12/2013, 9:54 AM  LOS: 2 days

## 2013-06-12 NOTE — Progress Notes (Signed)
Subjective: Perc chole drain placed 4/3 Up in chair- feels better   Objective: Vital signs in last 24 hours: Temp:  [97.4 F (36.3 C)-100.4 F (38 C)] 97.6 F (36.4 C) (04/04 0945) Pulse Rate:  [81-115] 93 (04/04 0945) Resp:  [16-29] 18 (04/04 0945) BP: (112-137)/(64-93) 132/87 mmHg (04/04 0945) SpO2:  [92 %-96 %] 94 % (04/04 0945) Last BM Date: 06/10/13  Intake/Output from previous day: 04/03 0701 - 04/04 0700 In: 949 [I.V.:944] Out: 1330 [Urine:1200; Drains:130] Intake/Output this shift: Total I/O In: 360 [P.O.:360] Out: -   PE:  T max 100.4/ 99.5 now Pleasant Drain intact; output 130 cc yesterday 20 cc in bag; bloody/infection Site clean and dry; NT Wbc 12.1 (22.5)  Lab Results:   Recent Labs  06/11/13 0450 06/12/13 0405  WBC 22.5* 12.0*  HGB 16.2 13.7  HCT 46.1 40.4  PLT 172 151   BMET  Recent Labs  06/11/13 0450 06/12/13 0405  NA 136* 140  K 3.7 3.7  CL 99 102  CO2 21 24  GLUCOSE 155* 106*  BUN 15 20  CREATININE 1.02 1.13  CALCIUM 9.0 8.8   PT/INR  Recent Labs  06/10/13 2250 06/11/13 0450  LABPROT 14.7 15.9*  INR 1.17 1.30   ABG No results found for this basename: PHART, PCO2, PO2, HCO3,  in the last 72 hours  Studies/Results: Ir Perc Cholecystostomy  06/11/2013   CLINICAL DATA:  ACUTE CHOLECYSTITIS, RIGHT UPPER QUADRANT PAIN, POSSIBLE SMALL SECONDARY HEPATIC ABSCESSES  EXAM: ULTRASOUND AND FLUOROSCOPIC TRANSHEPATIC CHOLECYSTOSTOMY  Date:  4/3/20154/05/2013 1:12 PM  Radiologist:  Judie PetitM. Ruel Favorsrevor Shick, MD  Guidance:  Ultrasound and fluoroscopic  FLUOROSCOPY TIME:  1 min  MEDICATIONS AND MEDICAL HISTORY: Patient is already receiving antibiotics currently, 4 mg versus a, 100 mcg fentanyl  ANESTHESIA/SEDATION: 15 min  CONTRAST:  10mL OMNIPAQUE IOHEXOL 300 MG/ML  SOLN  COMPLICATIONS: No immediate  PROCEDURE: Informed consent was obtained from the patient following explanation of the procedure, risks, benefits and alternatives. The patient  understands, agrees and consents for the procedure. All questions were addressed. A time out was performed.  Maximal barrier sterile technique utilized including caps, mask, sterile gowns, sterile gloves, large sterile drape, hand hygiene, and Betadine.  Previous imaging reviewed. Preliminary ultrasound performed of the gallbladder in the right upper quadrant. Overlying skin was marked. Under sterile conditions and local anesthesia, a 22 gauge access needle was advanced under ultrasound through a transhepatic windows into the gallbladder lumen. Needle position confirmed with ultrasound. There was return of malodorous exudative bile. Sample sent for Gram stain and culture. Guidewire inserted followed by transitional dilator. Amplatz guidewire exchange performed. Tract dilatation performed to advance a 10 French catheter. Retention loop formed in the gallbladder lumen. 100 cc bile aspirated. Gallbladder was decompressed. Contrast injection confirms position. Catheter secured with a Prolene suture and connected to gravity drainage bag. No immediate complication.  IMPRESSION: Successful fluoroscopic and ultrasound-guided 10 French transhepatic cholecystostomy   Electronically Signed   By: Ruel Favorsrevor  Shick M.D.   On: 06/11/2013 13:22   Dg Chest Port 1 View  06/10/2013   CLINICAL DATA:  Shortness of breath, abdominal pain and distention.  EXAM: PORTABLE CHEST - 1 VIEW  COMPARISON:  None.  FINDINGS: The lungs are well-aerated. Mild bibasilar opacities likely reflect atelectasis. There is no evidence of pleural effusion or pneumothorax.  The cardiomediastinal silhouette is within normal limits. No acute osseous abnormalities are seen.  IMPRESSION: Mild bibasilar airspace opacities likely reflect atelectasis; lungs otherwise clear.  Electronically Signed   By: Roanna Raider M.D.   On: 06/10/2013 21:31    Anti-infectives: Anti-infectives   Start     Dose/Rate Route Frequency Ordered Stop   06/10/13 2200  cefTRIAXone  (ROCEPHIN) 1 g in dextrose 5 % 50 mL IVPB     1 g 100 mL/hr over 30 Minutes Intravenous Every 24 hours 06/10/13 2051     06/10/13 2200  metroNIDAZOLE (FLAGYL) IVPB 500 mg     500 mg 100 mL/hr over 60 Minutes Intravenous Every 8 hours 06/10/13 2051        Assessment/Plan: s/p * No surgery found *  Per chole placed 4/3 Doing well Need to maintain drain x 6 weeks unless OR first   LOS: 2 days    Vincent Rios A 06/12/2013

## 2013-06-12 NOTE — Progress Notes (Signed)
Patient ID: Vincent Rios, male   DOB: 29-Jun-1955, 58 y.o.   MRN: [id number]    Subjective: Feels significantly better after placement of drain. No nausea tolerating clear liquids. Still some mild to moderate pain in the right upper quadrant.  Objective: Vital signs in last 24 hours: Temp:  [97.4 F (36.3 C)-100.4 F (38 C)] 99.5 F (37.5 C) (04/04 0521) Pulse Rate:  [81-115] 81 (04/04 0521) Resp:  [16-29] 18 (04/04 0521) BP: (112-137)/(64-93) 124/73 mmHg (04/04 0521) SpO2:  [92 %-96 %] 95 % (04/04 0521) Last BM Date: 06/10/13  Intake/Output from previous day: 04/03 0701 - 04/04 0700 In: 949 [I.V.:944] Out: 1330 [Urine:1200; Drains:130] Intake/Output this shift:    General appearance: alert, cooperative and no distress GI: mild to moderate localized right upper quadrant tenderness. No distention. Percutaneous drain in place with serosanguineous drainage.  Lab Results:   Recent Labs  06/11/13 0450 06/12/13 0405  WBC 22.5* 12.0*  HGB 16.2 13.7  HCT 46.1 40.4  PLT 172 151   BMET  Recent Labs  06/11/13 0450 06/12/13 0405  NA 136* 140  K 3.7 3.7  CL 99 102  CO2 21 24  GLUCOSE 155* 106*  BUN 15 20  CREATININE 1.02 1.13  CALCIUM 9.0 8.8     Studies/Results: Ir Perc Cholecystostomy  06/11/2013   CLINICAL DATA:  ACUTE CHOLECYSTITIS, RIGHT UPPER QUADRANT PAIN, POSSIBLE SMALL SECONDARY HEPATIC ABSCESSES  EXAM: ULTRASOUND AND FLUOROSCOPIC TRANSHEPATIC CHOLECYSTOSTOMY  Date:  4/3/20154/05/2013 1:12 PM  Radiologist:  Judie PetitM. Ruel Favorsrevor Shick, MD  Guidance:  Ultrasound and fluoroscopic  FLUOROSCOPY TIME:  1 min  MEDICATIONS AND MEDICAL HISTORY: Patient is already receiving antibiotics currently, 4 mg versus a, 100 mcg fentanyl  ANESTHESIA/SEDATION: 15 min  CONTRAST:  10mL OMNIPAQUE IOHEXOL 300 MG/ML  SOLN  COMPLICATIONS: No immediate  PROCEDURE: Informed consent was obtained from the patient following explanation of the procedure, risks, benefits and alternatives. The patient  understands, agrees and consents for the procedure. All questions were addressed. A time out was performed.  Maximal barrier sterile technique utilized including caps, mask, sterile gowns, sterile gloves, large sterile drape, hand hygiene, and Betadine.  Previous imaging reviewed. Preliminary ultrasound performed of the gallbladder in the right upper quadrant. Overlying skin was marked. Under sterile conditions and local anesthesia, a 22 gauge access needle was advanced under ultrasound through a transhepatic windows into the gallbladder lumen. Needle position confirmed with ultrasound. There was return of malodorous exudative bile. Sample sent for Gram stain and culture. Guidewire inserted followed by transitional dilator. Amplatz guidewire exchange performed. Tract dilatation performed to advance a 10 French catheter. Retention loop formed in the gallbladder lumen. 100 cc bile aspirated. Gallbladder was decompressed. Contrast injection confirms position. Catheter secured with a Prolene suture and connected to gravity drainage bag. No immediate complication.  IMPRESSION: Successful fluoroscopic and ultrasound-guided 10 French transhepatic cholecystostomy   Electronically Signed   By: Ruel Favorsrevor  Shick M.D.   On: 06/11/2013 13:22   Dg Chest Port 1 View  06/10/2013   CLINICAL DATA:  Shortness of breath, abdominal pain and distention.  EXAM: PORTABLE CHEST - 1 VIEW  COMPARISON:  None.  FINDINGS: The lungs are well-aerated. Mild bibasilar opacities likely reflect atelectasis. There is no evidence of pleural effusion or pneumothorax.  The cardiomediastinal silhouette is within normal limits. No acute osseous abnormalities are seen.  IMPRESSION: Mild bibasilar airspace opacities likely reflect atelectasis; lungs otherwise clear.   Electronically Signed   By: Roanna RaiderJeffery  Chang M.D.   On:  06/10/2013 21:31    Anti-infectives: Anti-infectives   Start     Dose/Rate Route Frequency Ordered Stop   06/10/13 2200  cefTRIAXone  (ROCEPHIN) 1 g in dextrose 5 % 50 mL IVPB     1 g 100 mL/hr over 30 Minutes Intravenous Every 24 hours 06/10/13 2051     06/10/13 2200  metroNIDAZOLE (FLAGYL) IVPB 500 mg     500 mg 100 mL/hr over 60 Minutes Intravenous Every 8 hours 06/10/13 2051        Assessment/Plan: Severe acute cholecystitis and small adjacent liver abscesses. Significantly improved following percutaneous drainage. Continue IV antibiotics. Full liquid diet. Ambulation encouraged.    LOS: 2 days    Fumie Fiallo T 06/12/2013

## 2013-06-13 LAB — CBC
HCT: 39.9 % (ref 39.0–52.0)
Hemoglobin: 13.8 g/dL (ref 13.0–17.0)
MCH: 28.8 pg (ref 26.0–34.0)
MCHC: 34.6 g/dL (ref 30.0–36.0)
MCV: 83.3 fL (ref 78.0–100.0)
Platelets: 168 10*3/uL (ref 150–400)
RBC: 4.79 MIL/uL (ref 4.22–5.81)
RDW: 14 % (ref 11.5–15.5)
WBC: 9.4 10*3/uL (ref 4.0–10.5)

## 2013-06-13 LAB — BASIC METABOLIC PANEL WITH GFR
BUN: 14 mg/dL (ref 6–23)
CO2: 21 meq/L (ref 19–32)
Calcium: 8.3 mg/dL — ABNORMAL LOW (ref 8.4–10.5)
Chloride: 103 meq/L (ref 96–112)
Creatinine, Ser: 0.91 mg/dL (ref 0.50–1.35)
GFR calc Af Amer: >90 mL/min
GFR calc non Af Amer: >90 mL/min
Glucose, Bld: 103 mg/dL — ABNORMAL HIGH (ref 70–99)
Potassium: 3.4 meq/L — ABNORMAL LOW (ref 3.7–5.3)
Sodium: 138 meq/L (ref 137–147)

## 2013-06-13 MED ORDER — PANTOPRAZOLE SODIUM 40 MG PO TBEC
40.0000 mg | DELAYED_RELEASE_TABLET | Freq: Two times a day (BID) | ORAL | Status: DC
  Administered 2013-06-13 – 2013-06-15 (×4): 40 mg via ORAL
  Filled 2013-06-13 (×4): qty 1

## 2013-06-13 MED ORDER — OXYCODONE HCL 5 MG PO TABS
5.0000 mg | ORAL_TABLET | ORAL | Status: DC | PRN
  Administered 2013-06-13: 5 mg via ORAL
  Administered 2013-06-13 – 2013-06-14 (×3): 10 mg via ORAL
  Filled 2013-06-13 (×2): qty 2
  Filled 2013-06-13: qty 1
  Filled 2013-06-13: qty 2

## 2013-06-13 MED ORDER — GI COCKTAIL ~~LOC~~
30.0000 mL | Freq: Three times a day (TID) | ORAL | Status: DC | PRN
  Filled 2013-06-13: qty 30

## 2013-06-13 MED ORDER — SACCHAROMYCES BOULARDII 250 MG PO CAPS
250.0000 mg | ORAL_CAPSULE | Freq: Two times a day (BID) | ORAL | Status: DC
  Administered 2013-06-13 – 2013-06-15 (×4): 250 mg via ORAL
  Filled 2013-06-13 (×5): qty 1

## 2013-06-13 NOTE — Progress Notes (Signed)
Subjective: Perc chole drain placed 4/3 Doing great Up in chair Feeling better daily   Objective: Vital signs in last 24 hours: Temp:  [97.6 F (36.4 C)-100.5 F (38.1 C)] 99.1 F (37.3 C) (04/05 0545) Pulse Rate:  [84-104] 84 (04/05 0545) Resp:  [17-19] 18 (04/05 0545) BP: (132-151)/(60-96) 151/93 mmHg (04/05 0545) SpO2:  [93 %-98 %] 95 % (04/05 0545) Weight:  [116.983 kg (257 lb 14.4 oz)] 116.983 kg (257 lb 14.4 oz) (04/05 0556) Last BM Date: 06/12/13  Intake/Output from previous day: 04/04 0701 - 04/05 0700 In: 1365 [P.O.:1360] Out: 2330 [Urine:2200; Drains:130] Intake/Output this shift:    PE:  T max 100.5; 99 now Output 130 cc yesterday; 20 cc in bag now- Bilious Site clean and dry; NT Cx: g+ rods; g- rods   Lab Results:   Recent Labs  06/12/13 0405 06/13/13 0614  WBC 12.0* 9.4  HGB 13.7 13.8  HCT 40.4 39.9  PLT 151 168   BMET  Recent Labs  06/12/13 0405 06/13/13 0614  NA 140 138  K 3.7 3.4*  CL 102 103  CO2 24 21  GLUCOSE 106* 103*  BUN 20 14  CREATININE 1.13 0.91  CALCIUM 8.8 8.3*   PT/INR  Recent Labs  06/10/13 2250 06/11/13 0450  LABPROT 14.7 15.9*  INR 1.17 1.30   ABG No results found for this basename: PHART, PCO2, PO2, HCO3,  in the last 72 hours  Studies/Results: Ir Perc Cholecystostomy  06/11/2013   CLINICAL DATA:  ACUTE CHOLECYSTITIS, RIGHT UPPER QUADRANT PAIN, POSSIBLE SMALL SECONDARY HEPATIC ABSCESSES  EXAM: ULTRASOUND AND FLUOROSCOPIC TRANSHEPATIC CHOLECYSTOSTOMY  Date:  4/3/20154/05/2013 1:12 PM  Radiologist:  Judie Petit. Ruel Favors, MD  Guidance:  Ultrasound and fluoroscopic  FLUOROSCOPY TIME:  1 min  MEDICATIONS AND MEDICAL HISTORY: Patient is already receiving antibiotics currently, 4 mg versus a, 100 mcg fentanyl  ANESTHESIA/SEDATION: 15 min  CONTRAST:  10mL OMNIPAQUE IOHEXOL 300 MG/ML  SOLN  COMPLICATIONS: No immediate  PROCEDURE: Informed consent was obtained from the patient following explanation of the procedure, risks,  benefits and alternatives. The patient understands, agrees and consents for the procedure. All questions were addressed. A time out was performed.  Maximal barrier sterile technique utilized including caps, mask, sterile gowns, sterile gloves, large sterile drape, hand hygiene, and Betadine.  Previous imaging reviewed. Preliminary ultrasound performed of the gallbladder in the right upper quadrant. Overlying skin was marked. Under sterile conditions and local anesthesia, a 22 gauge access needle was advanced under ultrasound through a transhepatic windows into the gallbladder lumen. Needle position confirmed with ultrasound. There was return of malodorous exudative bile. Sample sent for Gram stain and culture. Guidewire inserted followed by transitional dilator. Amplatz guidewire exchange performed. Tract dilatation performed to advance a 10 French catheter. Retention loop formed in the gallbladder lumen. 100 cc bile aspirated. Gallbladder was decompressed. Contrast injection confirms position. Catheter secured with a Prolene suture and connected to gravity drainage bag. No immediate complication.  IMPRESSION: Successful fluoroscopic and ultrasound-guided 10 French transhepatic cholecystostomy   Electronically Signed   By: Ruel Favors M.D.   On: 06/11/2013 13:22    Anti-infectives: Anti-infectives   Start     Dose/Rate Route Frequency Ordered Stop   06/10/13 2200  cefTRIAXone (ROCEPHIN) 1 g in dextrose 5 % 50 mL IVPB     1 g 100 mL/hr over 30 Minutes Intravenous Every 24 hours 06/10/13 2051     06/10/13 2200  metroNIDAZOLE (FLAGYL) IVPB 500 mg     500  mg 100 mL/hr over 60 Minutes Intravenous Every 8 hours 06/10/13 2051        Assessment/Plan: s/p * No surgery found *  Perc chole intact Will need to remain 6 weeks Plan per [facility]   LOS: 3 days    Lillianah Swartzentruber A 06/13/2013

## 2013-06-13 NOTE — Progress Notes (Signed)
PROGRESS NOTE  Vincent Rios YQM:[id number] DOB: 07-16-1955 DOA: 06/10/2013 PCP: Kirstie Peri, MD  Assessment/Plan:    acute Cholecystitis: s/p perc drain in IR. Abx, cultures pending    Hypertension: resume home meds    Dyslipidemia: resume home meds    Hepatic abscess: Defer to surgery and IR- no intervention needed at this time per Dr. Miles Costain    Sepsis: no shock, resolved  Reported dark stool: No further stools here and hemoglobin is stable. Hemoccult ordered. RN to collect.  Leukocytosis: decreasing to normal   Code Status:  full Family Communication:  Wife at bedside Disposition Plan:  home  Consultants:  General surgery  IR  Procedures:     Antibiotics:  Ceftriaxone 4/2  Flagyl 4/2  HPI/Subjective: No overnight events  Objective: Filed Vitals:   06/13/13 0545  BP: 151/93  Pulse: 84  Temp: 99.1 F (37.3 C)  Resp: 18    Intake/Output Summary (Last 24 hours) at 06/13/13 1018 Last data filed at 06/13/13 0943  Gross per 24 hour  Intake   1365 ml  Output   2730 ml  Net  -1365 ml   Filed Weights   06/11/13 0500 06/13/13 0556  Weight: 117.028 kg (258 lb) 116.983 kg (257 lb 14.4 oz)     Exam:   General:  Alert, oriented. Diaphoretic.  Cardiovascular: Tachycardic. Regular  Respiratory: Regular rate rhythm without murmurs without rubs  Abdomen: Soft. Right upper quadrant tender.  Ext: No clubbing cyanosis or edema.  Basic Metabolic Panel:  Recent Labs Lab 06/10/13 2250 06/11/13 0450 06/12/13 0405 06/13/13 0614  NA 132* 136* 140 138  K 3.8 3.7 3.7 3.4*  CL 93* 99 102 103  CO2 21 21 24 21   GLUCOSE 134* 155* 106* 103*  BUN 15 15 20 14   CREATININE 1.08 1.02 1.13 0.91  CALCIUM 9.6 9.0 8.8 8.3*  MG 1.8  --   --   --   PHOS 2.2*  --   --   --    Liver Function Tests:  Recent Labs Lab 06/10/13 2250 06/11/13 0450 06/12/13 0405  AST 22 17 22   ALT 19 17 19   ALKPHOS 94 81 83  BILITOT 2.3* 1.7* 1.1  PROT 7.7 6.8 6.2    ALBUMIN 3.4* 2.9* 2.4*   No results found for this basename: LIPASE, AMYLASE,  in the last 168 hours No results found for this basename: AMMONIA,  in the last 168 hours CBC:  Recent Labs Lab 06/10/13 2250 06/11/13 0450 06/12/13 0405 06/13/13 0614  WBC 26.3* 22.5* 12.0* 9.4  NEUTROABS 22.6* 19.7*  --   --   HGB 17.4* 16.2 13.7 13.8  HCT 48.9 46.1 40.4 39.9  MCV 82.9 82.2 84.9 83.3  PLT 191 172 151 168   Cardiac Enzymes: No results found for this basename: CKTOTAL, CKMB, CKMBINDEX, TROPONINI,  in the last 168 hours BNP (last 3 results) No results found for this basename: PROBNP,  in the last 8760 hours CBG: No results found for this basename: GLUCAP,  in the last 168 hours  Recent Results (from the past 240 hour(s))  CULTURE, BLOOD (ROUTINE X 2)     Status: None   Collection Time    06/10/13 10:50 PM      Result Value Ref Range Status   Specimen Description BLOOD RIGHT ARM   Final   Special Requests BOTTLES DRAWN AEROBIC ONLY 10CC   Final   Culture  Setup Time     Final  Value: 06/11/2013 04:02     Performed at [facility]   Culture     Final   Value:        BLOOD CULTURE RECEIVED NO GROWTH TO DATE CULTURE WILL BE HELD FOR 5 DAYS BEFORE ISSUING A FINAL NEGATIVE REPORT     Performed at [facility]   Report Status PENDING   Incomplete  CULTURE, BLOOD (ROUTINE X 2)     Status: None   Collection Time    06/10/13 10:55 PM      Result Value Ref Range Status   Specimen Description BLOOD LEFT HAND   Final   Special Requests BOTTLES DRAWN AEROBIC ONLY 10CC   Final   Culture  Setup Time     Final   Value: 06/11/2013 04:03     Performed at [facility]   Culture     Final   Value:        BLOOD CULTURE RECEIVED NO GROWTH TO DATE CULTURE WILL BE HELD FOR 5 DAYS BEFORE ISSUING A FINAL NEGATIVE REPORT     Performed at [facility]   Report Status PENDING   Incomplete  CULTURE, ROUTINE-ABSCESS     Status: None   Collection Time    06/11/13   1:23 PM      Result Value Ref Range Status   Specimen Description ABSCESS GALLBLADDER   Final   Special Requests GALLBLADDER   Final   Gram Stain     Final   Value: NO WBC SEEN     NO SQUAMOUS EPITHELIAL CELLS SEEN     FEW GRAM POSITIVE RODS     Performed at [facility]   Culture     Final   Value: MODERATE GRAM NEGATIVE RODS     Performed at [facility]   Report Status PENDING   Incomplete  ANAEROBIC CULTURE     Status: None   Collection Time    06/11/13  1:23 PM      Result Value Ref Range Status   Specimen Description ABSCESS GALLBLADDER   Final   Special Requests GALLBLADDER   Final   Gram Stain     Final   Value: NO WBC SEEN     NO SQUAMOUS EPITHELIAL CELLS SEEN     FEW GRAM POSITIVE RODS     Performed at [facility]   Culture     Final   Value: NO ANAEROBES ISOLATED; CULTURE IN PROGRESS FOR 5 DAYS     Performed at [facility]   Report Status PENDING   Incomplete     Studies: Ir Perc Cholecystostomy  06/11/2013   CLINICAL DATA:  ACUTE CHOLECYSTITIS, RIGHT UPPER QUADRANT PAIN, POSSIBLE SMALL SECONDARY HEPATIC ABSCESSES  EXAM: ULTRASOUND AND FLUOROSCOPIC TRANSHEPATIC CHOLECYSTOSTOMY  Date:  4/3/20154/05/2013 1:12 PM  Radiologist:  Judie PetitM. Ruel Favorsrevor Shick, MD  Guidance:  Ultrasound and fluoroscopic  FLUOROSCOPY TIME:  1 min  MEDICATIONS AND MEDICAL HISTORY: Patient is already receiving antibiotics currently, 4 mg versus a, 100 mcg fentanyl  ANESTHESIA/SEDATION: 15 min  CONTRAST:  10mL OMNIPAQUE IOHEXOL 300 MG/ML  SOLN  COMPLICATIONS: No immediate  PROCEDURE: Informed consent was obtained from the patient following explanation of the procedure, risks, benefits and alternatives. The patient understands, agrees and consents for the procedure. All questions were addressed. A time out was performed.  Maximal barrier sterile technique utilized including caps, mask, sterile gowns, sterile gloves, large sterile drape, hand hygiene, and Betadine.  Previous imaging  reviewed. Preliminary ultrasound performed of the gallbladder in the right upper quadrant. Overlying skin was marked. Under sterile conditions and local anesthesia, a 22 gauge access needle was advanced under ultrasound through a transhepatic windows into the gallbladder lumen. Needle position confirmed with ultrasound. There was return of malodorous exudative bile. Sample sent for Gram stain and culture. Guidewire inserted followed by transitional dilator. Amplatz guidewire exchange performed. Tract dilatation performed to advance a 10 French catheter. Retention loop formed in the gallbladder lumen. 100 cc bile aspirated. Gallbladder was decompressed. Contrast injection confirms position. Catheter secured with a Prolene suture and connected to gravity drainage bag. No immediate complication.  IMPRESSION: Successful fluoroscopic and ultrasound-guided 10 French transhepatic cholecystostomy   Electronically Signed   By: Ruel Favors M.D.   On: 06/11/2013 13:22    Scheduled Meds: . antiseptic oral rinse  15 mL Mouth Rinse q12n4p  . atorvastatin  10 mg Oral q1800  . cefTRIAXone (ROCEPHIN)  IV  1 g Intravenous Q24H  . chlorhexidine  15 mL Mouth Rinse BID  . losartan  100 mg Oral Daily  . metronidazole  500 mg Intravenous Q8H  . omega-3 acid ethyl esters  1 g Oral BID  . pantoprazole (PROTONIX) IV  40 mg Intravenous Q12H  . sodium chloride  3 mL Intravenous Q12H   Continuous Infusions: . sodium chloride 75 mL/hr at 06/13/13 0927    Time spent: 25 minutes  Marlin Canary  [facility] Pager [phone]. If 7PM-7AM, please contact night-coverage at [email or web address], password [facility] 06/13/2013, 10:18 AM  LOS: 3 days

## 2013-06-13 NOTE — Progress Notes (Signed)
Patient ID: Vincent Rios, male   DOB: 09-25-1955, 58 y.o.   MRN: [id number]    Subjective: Had some upper abdominal pain last night but better this morning. Overall feels he is improving. Tolerating liquids and would like something to eat.  Objective: Vital signs in last 24 hours: Temp:  [97.6 F (36.4 C)-100.5 F (38.1 C)] 99.1 F (37.3 C) (04/05 0545) Pulse Rate:  [84-104] 84 (04/05 0545) Resp:  [17-19] 18 (04/05 0545) BP: (132-151)/(60-96) 151/93 mmHg (04/05 0545) SpO2:  [93 %-98 %] 95 % (04/05 0545) Weight:  [257 lb 14.4 oz (409.811 kg)] 257 lb 14.4 oz (116.983 kg) (04/05 0556) Last BM Date: 06/12/13  Intake/Output from previous day: 04/04 0701 - 04/05 0700 In: 1365 [P.O.:1360] Out: 2300 [Urine:2200; Drains:100] Intake/Output this shift:    General appearance: alert, cooperative and no distress GI: normal findings: soft, non-tender and percutaneous drain right upper quadrant with questionable bile tinged  serosanguineous drainage and some particulate matter.  Lab Results:   Recent Labs  06/12/13 0405 06/13/13 0614  WBC 12.0* 9.4  HGB 13.7 13.8  HCT 40.4 39.9  PLT 151 168   BMET  Recent Labs  06/12/13 0405 06/13/13 0614  NA 140 138  K 3.7 3.4*  CL 102 103  CO2 24 21  GLUCOSE 106* 103*  BUN 20 14  CREATININE 1.13 0.91  CALCIUM 8.8 8.3*     Studies/Results: Ir Perc Cholecystostomy  06/11/2013   CLINICAL DATA:  ACUTE CHOLECYSTITIS, RIGHT UPPER QUADRANT PAIN, POSSIBLE SMALL SECONDARY HEPATIC ABSCESSES  EXAM: ULTRASOUND AND FLUOROSCOPIC TRANSHEPATIC CHOLECYSTOSTOMY  Date:  4/3/20154/05/2013 1:12 PM  Radiologist:  Judie Petit. Ruel Favors, MD  Guidance:  Ultrasound and fluoroscopic  FLUOROSCOPY TIME:  1 min  MEDICATIONS AND MEDICAL HISTORY: Patient is already receiving antibiotics currently, 4 mg versus a, 100 mcg fentanyl  ANESTHESIA/SEDATION: 15 min  CONTRAST:  10mL OMNIPAQUE IOHEXOL 300 MG/ML  SOLN  COMPLICATIONS: No immediate  PROCEDURE: Informed consent was obtained  from the patient following explanation of the procedure, risks, benefits and alternatives. The patient understands, agrees and consents for the procedure. All questions were addressed. A time out was performed.  Maximal barrier sterile technique utilized including caps, mask, sterile gowns, sterile gloves, large sterile drape, hand hygiene, and Betadine.  Previous imaging reviewed. Preliminary ultrasound performed of the gallbladder in the right upper quadrant. Overlying skin was marked. Under sterile conditions and local anesthesia, a 22 gauge access needle was advanced under ultrasound through a transhepatic windows into the gallbladder lumen. Needle position confirmed with ultrasound. There was return of malodorous exudative bile. Sample sent for Gram stain and culture. Guidewire inserted followed by transitional dilator. Amplatz guidewire exchange performed. Tract dilatation performed to advance a 10 French catheter. Retention loop formed in the gallbladder lumen. 100 cc bile aspirated. Gallbladder was decompressed. Contrast injection confirms position. Catheter secured with a Prolene suture and connected to gravity drainage bag. No immediate complication.  IMPRESSION: Successful fluoroscopic and ultrasound-guided 10 French transhepatic cholecystostomy   Electronically Signed   By: Ruel Favors M.D.   On: 06/11/2013 13:22    Anti-infectives: Anti-infectives   Start     Dose/Rate Route Frequency Ordered Stop   06/10/13 2200  cefTRIAXone (ROCEPHIN) 1 g in dextrose 5 % 50 mL IVPB     1 g 100 mL/hr over 30 Minutes Intravenous Every 24 hours 06/10/13 2051     06/10/13 2200  metroNIDAZOLE (FLAGYL) IVPB 500 mg     500 mg 100 mL/hr over 60  Minutes Intravenous Every 8 hours 06/10/13 2051        Assessment/Plan: Severe acute cholecystitis and associated small liver abscess these. Continued improvement after percutaneous drainage with normalization of white count. Abdomen is nontender. Still with some  low-grade fever. Continue IV antibiotics. I will advance diet.    LOS: 3 days    Isabeau Mccalla T 06/13/2013

## 2013-06-14 LAB — GI PATHOGEN PANEL BY PCR, STOOL
C difficile toxin A/B: NEGATIVE
Campylobacter by PCR: NEGATIVE
Cryptosporidium by PCR: NEGATIVE
E coli (ETEC) LT/ST: NEGATIVE
E coli (STEC): NEGATIVE
E coli 0157 by PCR: NEGATIVE
G lamblia by PCR: NEGATIVE
Norovirus GI/GII: NEGATIVE
Rotavirus A by PCR: NEGATIVE
Salmonella by PCR: NEGATIVE
Shigella by PCR: NEGATIVE

## 2013-06-14 LAB — CULTURE, ROUTINE-ABSCESS: Gram Stain: NONE SEEN

## 2013-06-14 MED ORDER — POTASSIUM CHLORIDE CRYS ER 20 MEQ PO TBCR
40.0000 meq | EXTENDED_RELEASE_TABLET | Freq: Once | ORAL | Status: AC
  Administered 2013-06-14: 40 meq via ORAL
  Filled 2013-06-14: qty 2

## 2013-06-14 MED ORDER — AMOXICILLIN-POT CLAVULANATE 875-125 MG PO TABS
1.0000 | ORAL_TABLET | Freq: Two times a day (BID) | ORAL | Status: DC
Start: 2013-06-14 — End: 2013-06-15
  Administered 2013-06-14 – 2013-06-15 (×3): 1 via ORAL
  Filled 2013-06-14 (×4): qty 1

## 2013-06-14 MED ORDER — METRONIDAZOLE 500 MG PO TABS: 500.0000 mg | ORAL_TABLET | Freq: Three times a day (TID) | ORAL | Status: DC

## 2013-06-14 NOTE — Progress Notes (Signed)
PROGRESS NOTE  Vincent Rios ZHY:[id number] DOB: 1955-07-29 DOA: 06/10/2013 PCP: Kirstie PeriSHAH,ASHISH, MD  Assessment/Plan:    acute Cholecystitis: s/p perc drain in IR. Abx- change to PO, cultures pending    Hypertension: resume home meds    Dyslipidemia: resume home meds    Hepatic abscess: Defer to surgery and IR- no intervention needed at this time per Dr. Miles CostainShick    Sepsis: no shock, resolved  Hypokalemia- replete  Leukocytosis: decreasing to normal   Code Status:  full Family Communication:  family at bedside Disposition Plan:  home  Consultants:  General surgery  IR  Procedures:      HPI/Subjective: More solid BM today, not diarrhea No SOB, no CP   Objective: Filed Vitals:   06/14/13 0555  BP: 148/95  Pulse: 80  Temp: 97.8 F (36.6 C)  Resp: 17    Intake/Output Summary (Last 24 hours) at 06/14/13 1019 Last data filed at 06/14/13 0945  Gross per 24 hour  Intake   2115 ml  Output   1340 ml  Net    775 ml   Filed Weights   06/11/13 0500 06/13/13 0556 06/14/13 0555  Weight: 117.028 kg (258 lb) 116.983 kg (257 lb 14.4 oz) 113.853 kg (251 lb)     Exam:   General:  Alert, oriented..  Cardiovascular: . Regular  Respiratory: Regular rate rhythm without murmurs without rubs  Abdomen: Soft. Right upper quadrant tender.  Ext: No clubbing cyanosis or edema.  Basic Metabolic Panel:  Recent Labs Lab 06/10/13 2250 06/11/13 0450 06/12/13 0405 06/13/13 0614  NA 132* 136* 140 138  K 3.8 3.7 3.7 3.4*  CL 93* 99 102 103  CO2 21 21 24 21   GLUCOSE 134* 155* 106* 103*  BUN 15 15 20 14   CREATININE 1.08 1.02 1.13 0.91  CALCIUM 9.6 9.0 8.8 8.3*  MG 1.8  --   --   --   PHOS 2.2*  --   --   --    Liver Function Tests:  Recent Labs Lab 06/10/13 2250 06/11/13 0450 06/12/13 0405  AST 22 17 22   ALT 19 17 19   ALKPHOS 94 81 83  BILITOT 2.3* 1.7* 1.1  PROT 7.7 6.8 6.2  ALBUMIN 3.4* 2.9* 2.4*   No results found for this basename: LIPASE,  AMYLASE,  in the last 168 hours No results found for this basename: AMMONIA,  in the last 168 hours CBC:  Recent Labs Lab 06/10/13 2250 06/11/13 0450 06/12/13 0405 06/13/13 0614  WBC 26.3* 22.5* 12.0* 9.4  NEUTROABS 22.6* 19.7*  --   --   HGB 17.4* 16.2 13.7 13.8  HCT 48.9 46.1 40.4 39.9  MCV 82.9 82.2 84.9 83.3  PLT 191 172 151 168   Cardiac Enzymes: No results found for this basename: CKTOTAL, CKMB, CKMBINDEX, TROPONINI,  in the last 168 hours BNP (last 3 results) No results found for this basename: PROBNP,  in the last 8760 hours CBG: No results found for this basename: GLUCAP,  in the last 168 hours  Recent Results (from the past 240 hour(s))  CULTURE, BLOOD (ROUTINE X 2)     Status: None   Collection Time    06/10/13 10:50 PM      Result Value Ref Range Status   Specimen Description BLOOD RIGHT ARM   Final   Special Requests BOTTLES DRAWN AEROBIC ONLY 10CC   Final   Culture  Setup Time     Final   Value: 06/11/2013  04:02     Performed at [facility]     Final   Value:        BLOOD CULTURE RECEIVED NO GROWTH TO DATE CULTURE WILL BE HELD FOR 5 DAYS BEFORE ISSUING A FINAL NEGATIVE REPORT     Performed at [facility]   Report Status PENDING   Incomplete  CULTURE, BLOOD (ROUTINE X 2)     Status: None   Collection Time    06/10/13 10:55 PM      Result Value Ref Range Status   Specimen Description BLOOD LEFT HAND   Final   Special Requests BOTTLES DRAWN AEROBIC ONLY 10CC   Final   Culture  Setup Time     Final   Value: 06/11/2013 04:03     Performed at [facility]   Culture     Final   Value:        BLOOD CULTURE RECEIVED NO GROWTH TO DATE CULTURE WILL BE HELD FOR 5 DAYS BEFORE ISSUING A FINAL NEGATIVE REPORT     Performed at [facility]   Report Status PENDING   Incomplete  CULTURE, ROUTINE-ABSCESS     Status: None   Collection Time    06/11/13  1:23 PM      Result Value Ref Range Status   Specimen Description ABSCESS  GALLBLADDER   Final   Special Requests GALLBLADDER   Final   Gram Stain     Final   Value: NO WBC SEEN     NO SQUAMOUS EPITHELIAL CELLS SEEN     FEW GRAM POSITIVE RODS     Performed at [facility]   Culture     Final   Value: MODERATE KLEBSIELLA PNEUMONIAE     Performed at [facility]   Report Status 06/14/2013 FINAL   Final   Organism ID, Bacteria KLEBSIELLA PNEUMONIAE   Final  ANAEROBIC CULTURE     Status: None   Collection Time    06/11/13  1:23 PM      Result Value Ref Range Status   Specimen Description ABSCESS GALLBLADDER   Final   Special Requests GALLBLADDER   Final   Gram Stain     Final   Value: NO WBC SEEN     NO SQUAMOUS EPITHELIAL CELLS SEEN     FEW GRAM POSITIVE RODS     Performed at [facility]   Culture     Final   Value: NO ANAEROBES ISOLATED; CULTURE IN PROGRESS FOR 5 DAYS     Performed at [facility]   Report Status PENDING   Incomplete     Studies: No results found.  Scheduled Meds: . amoxicillin-clavulanate  1 tablet Oral Q12H  . atorvastatin  10 mg Oral q1800  . losartan  100 mg Oral Daily  . metroNIDAZOLE  500 mg Oral 3 times per day  . omega-3 acid ethyl esters  1 g Oral BID  . pantoprazole  40 mg Oral BID AC  . potassium chloride  40 mEq Oral Once  . saccharomyces boulardii  250 mg Oral BID  . sodium chloride  3 mL Intravenous Q12H   Continuous Infusions:    Time spent: 25 minutes  Mykal Kirchman  [facility] Pager [phone]. If 7PM-7AM, please contact night-coverage at [email or web address], password [facility] 06/14/2013, 10:19 AM  LOS: 4 days

## 2013-06-14 NOTE — Progress Notes (Signed)
Patient ID: Nash Mantishomas R Kumagai, male   DOB: 12-28-1955, 58 y.o.   MRN: [id number]    Subjective: Continues to feel better.  Still a bit bloated.  No n/v.    Objective: Vital signs in last 24 hours: Temp:  [97.8 F (36.6 C)-99.1 F (37.3 C)] 97.8 F (36.6 C) (04/06 0555) Pulse Rate:  [80-95] 80 (04/06 0555) Resp:  [17-18] 17 (04/06 0555) BP: (148-167)/(95-100) 148/95 mmHg (04/06 0555) SpO2:  [96 %-97 %] 96 % (04/06 0555) Weight:  [251 lb (113.853 kg)] 251 lb (113.853 kg) (04/06 0555) Last BM Date: 06/12/13  Intake/Output from previous day: 04/05 0701 - 04/06 0700 In: 2480 [P.O.:1520; I.V.:950] Out: 1285 [Urine:1200; Drains:85] Intake/Output this shift:    General appearance: alert, cooperative and no distress GI: normal findings: soft, non-tender and percutaneous drain right upper quadrant with questionable bile tinged  serosanguineous drainage and some particulate matter.  Lab Results:   Recent Labs  06/12/13 0405 06/13/13 0614  WBC 12.0* 9.4  HGB 13.7 13.8  HCT 40.4 39.9  PLT 151 168   BMET  Recent Labs  06/12/13 0405 06/13/13 0614  NA 140 138  K 3.7 3.4*  CL 102 103  CO2 24 21  GLUCOSE 106* 103*  BUN 20 14  CREATININE 1.13 0.91  CALCIUM 8.8 8.3*     Studies/Results: No results found.  Anti-infectives: Anti-infectives   Start     Dose/Rate Route Frequency Ordered Stop   06/10/13 2200  cefTRIAXone (ROCEPHIN) 1 g in dextrose 5 % 50 mL IVPB     1 g 100 mL/hr over 30 Minutes Intravenous Every 24 hours 06/10/13 2051     06/10/13 2200  metroNIDAZOLE (FLAGYL) IVPB 500 mg     500 mg 100 mL/hr over 60 Minutes Intravenous Every 8 hours 06/10/13 2051        Assessment/Plan: Severe acute cholecystitis and associated small liver abscess these. Continued improvement after percutaneous drainage with normalization of white count. Abdomen is nontender. Agree with change to po antibiotics and home tomorrow.      LOS: 4 days    [facility] Hospital[facility]ERLY,Brizza Nathanson 06/14/2013

## 2013-06-15 DIAGNOSIS — K750 Abscess of liver: Secondary | ICD-10-CM

## 2013-06-15 MED ORDER — AMOXICILLIN-POT CLAVULANATE 875-125 MG PO TABS: 1.0000 | ORAL_TABLET | Freq: Two times a day (BID) | ORAL | Status: DC

## 2013-06-15 MED ORDER — ONDANSETRON HCL 4 MG PO TABS: 4.0000 mg | ORAL_TABLET | Freq: Four times a day (QID) | ORAL | Status: DC | PRN

## 2013-06-15 MED ORDER — OXYCODONE HCL 5 MG PO TABS: 5.0000 mg | ORAL_TABLET | ORAL | Status: DC | PRN

## 2013-06-15 MED ORDER — PANTOPRAZOLE SODIUM 40 MG PO TBEC: 40.0000 mg | DELAYED_RELEASE_TABLET | Freq: Every day | ORAL | Status: DC

## 2013-06-15 NOTE — Progress Notes (Signed)
Patient given discharge instructions and follow-up information.  He verbalized understanding of all instructions.  Pt demonstrates emptying drain, RN also demonstrated to patient about flushing his drain. Prescription given to patient for saline flushes.  No questions or concerns at this time.  Pt ready for Discharge home.  1340---pt taken down for discharge home with son via wheelchair

## 2013-06-15 NOTE — Progress Notes (Addendum)
UR completed.  HHRN for drain mgmt education/support arranged with [facility] per patient choice and insurance network options. Pt confirms that the address and phone number in EPIC are correct.   *Pt will need Rx for PREFILLED STERILE NS SYRINGES that he can fill for flushing drains. HHRN can assist pt if he has difficulty finding them ([facility] is usually a good source).  Carlyle LipaMichelle Leisel Pinette, RN BSN MHA CCM Trauma/Neuro ICU Case Manager [phone]

## 2013-06-15 NOTE — Discharge Instructions (Signed)
Cholecystostomy  °The gallbladder is a pear-shaped organ that lies beneath the liver on the right side of the body. The gallbladder stores bile, a fluid that helps the body digest fats. However, sometimes bile and other fluids build up in the gallbladder because of an obstruction (for example, a gallstones). This can cause fever, pain, swelling, nausea and other serious symptoms. °The procedure used to drain these fluids is called a cholecystostomy. A tube is inserted into the gallbladder. Fluid drains through the tube into a plastic bag outside the body. This procedure is usually done on people who are admitted to the hospital. °The procedure is often recommended for people who cannot have gallbladder surgery right away, usually because they are too ill to make it through surgery. The cholecystostomy tube is usually temporary, until surgery can be done. °RISKS AND COMPLICATIONS °Although rare, complications can include: °· Clogging of the tube. °· Infection in or around the drain site. Antibiotics might be prescribed for the infection. Or, another tube might be inserted to drain the infected fluid. °· Internal bleeding from the liver. °BEFORE THE PROCEDURE  °· Try to quit smoking several weeks before the procedure. Smoking can slow healing. °· Arrange for someone to drive you home from the hospital. °· Right before your procedure, avoid all foods and liquids after midnight. This includes coffee, tea and water. °· On the day of the procedure, arrive early to fill out all the paperwork. °PROCEDURE °You will be given a sedative to make you sleepy and a local anesthetic to numb the skin. Next, a small cut is made in the abdomen. Then a tube is threaded through the cut into the gallbladder. The procedure is usually done with ultrasound to guide the tube into the gallbladder. Once the tube is in place, the drain is secured to the skin with a stitch. The tube is then connected to a drainage bag.  °AFTER THE PROCEDURE   °· People who have a cholecystostomy usually stay in the hospital for several days because they are so ill. You might not be able to eat for the first few days. Instead, you will be connected to an IV for fluids and nutrients. °· The procedure does not cure the blockage that caused the fluid to build up in the first place. Because of this, the gallbladder will need to be removed in the future. The drain is removed at that time. °HOME CARE INSTRUCTIONS  °Be sure to follow your healthcare provider's instructions carefully. You may shower but avoid tub baths and swimming until your caregiver says it is OK. Eat and drink according to the directions you have been given. And be sure to make all follow-up appointments.  °Call your healthcare provider if you notice new pain, redness or swelling around the wound. °SEEK IMMEDIATE MEDICAL CARE IF:  °· There is increased abdominal pain. °· Nausea or vomiting occurs. °· You develop a fever. °· The drainage tube comes out of the abdomen. °Document Released: 05/24/2008 Document Revised: 05/20/2011 Document Reviewed: 05/24/2008 °ExitCare® Patient Information ©2014 [employer]. ° °

## 2013-06-15 NOTE — Discharge Summary (Signed)
Physician Discharge Summary  Nash Mantishomas R Kinyon GNF:[id number] DOB: [birth year] DOA: 06/10/2013  PCP: Kirstie PeriSHAH,ASHISH, MD  Admit date: 06/10/2013 Discharge date: 06/15/2013  Time spent: 35 minutes  Recommendations for Outpatient Follow-up:  1. May return to work in 2 weeks- FMLA paperwork filled out  2. D/c with drain- home health for drain care 3. Augmentin for total of 2 weeks  Discharge Diagnoses:  Principal Problem:   Cholecystitis Active Problems:   Hypertension   Dyslipidemia   Hepatic abscess   Sepsis   Sinus tachycardia   Discharge Condition: improved  Diet recommendation: cardiac  Filed Weights   06/13/13 0556 06/14/13 0555 06/15/13 0640  Weight: 116.983 kg (257 lb 14.4 oz) 113.853 kg (251 lb) 107.684 kg (237 lb 6.4 oz)    History of present illness:  Vincent Rios is a 58 y.o. male with Past medical history of Hypertension and dyslipidemia, Former smoker.  The patient presented with complaints of right upper quadrant pain ongoing since last 2 days. He started having this pain during the day and progressively got worse. He initially felt this could be indigestion but then the pain was not improving and he started having episodes of nausea and fever and chills. He started having generalized weakness and tiredness. Earlier on Thursday morning the symptoms were worsening and he had one episode of black color bowel movement. This is he decided to go to his doctor. They performed initial workup which showed leukocytosis. He was sent for an ultrasound which showed thickened gallbladder with possible stone and hepatic lesion and was recommended MRI which showed possible hepatic abscess and conformed acute cholecystitis without any acute abnormality and therefore the patient was sent here for further workup.  At the time of my evaluation patient mentions his symptoms have improved but not significantly he continues to remain nauseous and has acid reflux. He does not have any fever or chills.  He had been passing gas but no bowel movements this morning.  He denies any prior episodes or prior abdominal surgeries he denies any recent diarrhea or travel. He denies any change in his medication.  He complains of some shortness of breath and he feels as the cause of abdominal pain limiting and taking deep breath.  Pt denies any headache, cough, chest pain, palpitation, orthopnea, PND, burning urination, dizziness, pedal edema, focal neurological deficit.  The patient is coming from home. And at her baseline independent for most of his ADL.      Hospital Course:  acute Cholecystitis: s/p perc drain in IR. Abx- change to PO for total of 2 weeks of treatment  Hypertension: resume home meds   Dyslipidemia: resume home meds   Hepatic abscess: Defer to surgery and IR- no intervention needed at this time per Dr. Miles CostainShick   Sepsis: no shock, resolved   Hypokalemia- replete   Leukocytosis: decreased to normal   Procedures:  drain  Consultations:  Surgery  IR  Discharge Exam: Filed Vitals:   06/15/13 0640  BP: 157/99  Pulse: 86  Temp: 98.1 F (36.7 C)  Resp: 16    General: A+Ox3, NAD Cardiovascular: rrr Respiratory: clear  Discharge Instructions You were cared for by a hospitalist during your hospital stay. If you have any questions about your discharge medications or the care you received while you were in the hospital after you are discharged, you can call the unit and asked to speak with the hospitalist on call if the hospitalist that took care of you is not available. Once you  are discharged, your primary care physician will handle any further medical issues. Please note that NO REFILLS for any discharge medications will be authorized once you are discharged, as it is imperative that you return to your primary care physician (or establish a relationship with a primary care physician if you do not have one) for your aftercare needs so that they can reassess your need for  medications and monitor your lab values.      Discharge Orders   Future Orders Complete By Expires   Diet - low sodium heart healthy  As directed    Discharge instructions  As directed    Comments:     Home health for drain care   Increase activity slowly  As directed        Medication List         amoxicillin-clavulanate 875-125 MG per tablet  Commonly known as:  AUGMENTIN  Take 1 tablet by mouth every 12 (twelve) hours.     Co Q-10 200 MG Caps  Take 400 mg by mouth daily.     losartan 50 MG tablet  Commonly known as:  COZAAR  Take 100 mg by mouth daily.     omega-3 acid ethyl esters 1 G capsule  Commonly known as:  LOVAZA  Take 1 g by mouth 2 (two) times daily.     ondansetron 4 MG tablet  Commonly known as:  ZOFRAN  Take 1 tablet (4 mg total) by mouth every 6 (six) hours as needed for nausea.     oxyCODONE 5 MG immediate release tablet  Commonly known as:  Oxy IR/ROXICODONE  Take 1-2 tablets (5-10 mg total) by mouth every 4 (four) hours as needed for severe pain.     pantoprazole 40 MG tablet  Commonly known as:  PROTONIX  Take 1 tablet (40 mg total) by mouth daily.     rosuvastatin 5 MG tablet  Commonly known as:  CRESTOR  Take 5 mg by mouth daily.       No Known Allergies Follow-up Information   Schedule an appointment as soon as possible for a visit with WYATT, Marta Lamas, MD. (3-4 weeks)    Specialty:  General Surgery   Contact information:   [address]  [place] [address] [phone]       Follow up with [facility], MD In 1 week.   Specialty:  Internal Medicine   Contact information:   [address]  [place] [address] [phone]        The results of significant diagnostics from this hospitalization (including imaging, microbiology, ancillary and laboratory) are listed below for reference.    Significant Diagnostic Studies: Ir Perc Cholecystostomy  06/11/2013   CLINICAL DATA:  ACUTE  CHOLECYSTITIS, RIGHT UPPER QUADRANT PAIN, POSSIBLE SMALL SECONDARY HEPATIC ABSCESSES  EXAM: ULTRASOUND AND FLUOROSCOPIC TRANSHEPATIC CHOLECYSTOSTOMY  Date:  4/3/20154/05/2013 1:12 PM  Radiologist:  Judie Petit. Ruel Favors, MD  Guidance:  Ultrasound and fluoroscopic  FLUOROSCOPY TIME:  1 min  MEDICATIONS AND MEDICAL HISTORY: Patient is already receiving antibiotics currently, 4 mg versus a, 100 mcg fentanyl  ANESTHESIA/SEDATION: 15 min  CONTRAST:  10mL OMNIPAQUE IOHEXOL 300 MG/ML  SOLN  COMPLICATIONS: No immediate  PROCEDURE: Informed consent was obtained from the patient following explanation of the procedure, risks, benefits and alternatives. The patient understands, agrees and consents for the procedure. All questions were addressed. A time out was performed.  Maximal barrier sterile technique utilized including caps, mask,  sterile gowns, sterile gloves, large sterile drape, hand hygiene, and Betadine.  Previous imaging reviewed. Preliminary ultrasound performed of the gallbladder in the right upper quadrant. Overlying skin was marked. Under sterile conditions and local anesthesia, a 22 gauge access needle was advanced under ultrasound through a transhepatic windows into the gallbladder lumen. Needle position confirmed with ultrasound. There was return of malodorous exudative bile. Sample sent for Gram stain and culture. Guidewire inserted followed by transitional dilator. Amplatz guidewire exchange performed. Tract dilatation performed to advance a 10 French catheter. Retention loop formed in the gallbladder lumen. 100 cc bile aspirated. Gallbladder was decompressed. Contrast injection confirms position. Catheter secured with a Prolene suture and connected to gravity drainage bag. No immediate complication.  IMPRESSION: Successful fluoroscopic and ultrasound-guided 10 French transhepatic cholecystostomy   Electronically Signed   By: Ruel Favors M.D.   On: 06/11/2013 13:22   Dg Chest Port 1 View  06/10/2013   CLINICAL  DATA:  Shortness of breath, abdominal pain and distention.  EXAM: PORTABLE CHEST - 1 VIEW  COMPARISON:  None.  FINDINGS: The lungs are well-aerated. Mild bibasilar opacities likely reflect atelectasis. There is no evidence of pleural effusion or pneumothorax.  The cardiomediastinal silhouette is within normal limits. No acute osseous abnormalities are seen.  IMPRESSION: Mild bibasilar airspace opacities likely reflect atelectasis; lungs otherwise clear.   Electronically Signed   By: Roanna Raider M.D.   On: 06/10/2013 21:31    Microbiology: Recent Results (from the past 240 hour(s))  CULTURE, BLOOD (ROUTINE X 2)     Status: None   Collection Time    06/10/13 10:50 PM      Result Value Ref Range Status   Specimen Description BLOOD RIGHT ARM   Final   Special Requests BOTTLES DRAWN AEROBIC ONLY 10CC   Final   Culture  Setup Time     Final   Value: 06/11/2013 04:02     Performed at [facility]   Culture     Final   Value:        BLOOD CULTURE RECEIVED NO GROWTH TO DATE CULTURE WILL BE HELD FOR 5 DAYS BEFORE ISSUING A FINAL NEGATIVE REPORT     Performed at [facility]   Report Status PENDING   Incomplete  CULTURE, BLOOD (ROUTINE X 2)     Status: None   Collection Time    06/10/13 10:55 PM      Result Value Ref Range Status   Specimen Description BLOOD LEFT HAND   Final   Special Requests BOTTLES DRAWN AEROBIC ONLY 10CC   Final   Culture  Setup Time     Final   Value: 06/11/2013 04:03     Performed at [facility]   Culture     Final   Value:        BLOOD CULTURE RECEIVED NO GROWTH TO DATE CULTURE WILL BE HELD FOR 5 DAYS BEFORE ISSUING A FINAL NEGATIVE REPORT     Performed at [facility]   Report Status PENDING   Incomplete  CULTURE, ROUTINE-ABSCESS     Status: None   Collection Time    06/11/13  1:23 PM      Result Value Ref Range Status   Specimen Description ABSCESS GALLBLADDER   Final   Special Requests GALLBLADDER   Final   Gram Stain     Final    Value: NO WBC SEEN     NO SQUAMOUS EPITHELIAL CELLS SEEN  FEW GRAM POSITIVE RODS     Performed at [facility]   Culture     Final   Value: MODERATE KLEBSIELLA PNEUMONIAE     Performed at [facility]   Report Status 06/14/2013 FINAL   Final   Organism ID, Bacteria KLEBSIELLA PNEUMONIAE   Final  ANAEROBIC CULTURE     Status: None   Collection Time    06/11/13  1:23 PM      Result Value Ref Range Status   Specimen Description ABSCESS GALLBLADDER   Final   Special Requests GALLBLADDER   Final   Gram Stain     Final   Value: NO WBC SEEN     NO SQUAMOUS EPITHELIAL CELLS SEEN     FEW GRAM POSITIVE RODS     Performed at [facility]   Culture     Final   Value: NO ANAEROBES ISOLATED; CULTURE IN PROGRESS FOR 5 DAYS     Performed at [facility]   Report Status PENDING   Incomplete     Labs: Basic Metabolic Panel:  Recent Labs Lab 06/10/13 2250 06/11/13 0450 06/12/13 0405 06/13/13 0614  NA 132* 136* 140 138  K 3.8 3.7 3.7 3.4*  CL 93* 99 102 103  CO2 21 21 24 21   GLUCOSE 134* 155* 106* 103*  BUN 15 15 20 14   CREATININE 1.08 1.02 1.13 0.91  CALCIUM 9.6 9.0 8.8 8.3*  MG 1.8  --   --   --   PHOS 2.2*  --   --   --    Liver Function Tests:  Recent Labs Lab 06/10/13 2250 06/11/13 0450 06/12/13 0405  AST 22 17 22   ALT 19 17 19   ALKPHOS 94 81 83  BILITOT 2.3* 1.7* 1.1  PROT 7.7 6.8 6.2  ALBUMIN 3.4* 2.9* 2.4*   No results found for this basename: LIPASE, AMYLASE,  in the last 168 hours No results found for this basename: AMMONIA,  in the last 168 hours CBC:  Recent Labs Lab 06/10/13 2250 06/11/13 0450 06/12/13 0405 06/13/13 0614  WBC 26.3* 22.5* 12.0* 9.4  NEUTROABS 22.6* 19.7*  --   --   HGB 17.4* 16.2 13.7 13.8  HCT 48.9 46.1 40.4 39.9  MCV 82.9 82.2 84.9 83.3  PLT 191 172 151 168   Cardiac Enzymes: No results found for this basename: CKTOTAL, CKMB, CKMBINDEX, TROPONINI,  in the last 168 hours BNP: BNP (last 3  results) No results found for this basename: PROBNP,  in the last 8760 hours CBG: No results found for this basename: GLUCAP,  in the last 168 hours     Signed:  Benjamine Mola, Trusten Hume  [facility] 06/15/2013, 10:34 AM

## 2013-06-15 NOTE — Progress Notes (Signed)
Patient ID: Vincent Rios, male   DOB: 04-09-1955, 58 y.o.   MRN: [id number]    Subjective: Pt feels well this morning.  Pain is controlled with pain medicine and is improved.  Tolerating a regular diet  Objective: Vital signs in last 24 hours: Temp:  [97.9 F (36.6 C)-98.9 F (37.2 C)] 98.1 F (36.7 C) (04/07 0640) Pulse Rate:  [79-87] 86 (04/07 0640) Resp:  [16] 16 (04/07 0640) BP: (137-157)/(77-99) 157/99 mmHg (04/07 0640) SpO2:  [95 %-100 %] 95 % (04/07 0640) Weight:  [237 lb 6.4 oz (107.684 kg)] 237 lb 6.4 oz (107.684 kg) (04/07 0640) Last BM Date: 06/14/13  Intake/Output from previous day: 04/06 0701 - 04/07 0700 In: 533.8 [P.O.:240; I.V.:293.8] Out: 1520 [Urine:1400; Drains:120] Intake/Output this shift:    PE: Abd: soft, less tender, +BS, ND, drain with small amount of bile  Lab Results:   Recent Labs  06/13/13 0614  WBC 9.4  HGB 13.8  HCT 39.9  PLT 168   BMET  Recent Labs  06/13/13 0614  NA 138  K 3.4*  CL 103  CO2 21  GLUCOSE 103*  BUN 14  CREATININE 0.91  CALCIUM 8.3*   PT/INR No results found for this basename: LABPROT, INR,  in the last 72 hours CMP     Component Value Date/Time   NA 138 06/13/2013 0614   K 3.4* 06/13/2013 0614   CL 103 06/13/2013 0614   CO2 21 06/13/2013 0614   GLUCOSE 103* 06/13/2013 0614   BUN 14 06/13/2013 0614   CREATININE 0.91 06/13/2013 0614   CALCIUM 8.3* 06/13/2013 0614   PROT 6.2 06/12/2013 0405   ALBUMIN 2.4* 06/12/2013 0405   AST 22 06/12/2013 0405   ALT 19 06/12/2013 0405   ALKPHOS 83 06/12/2013 0405   BILITOT 1.1 06/12/2013 0405   GFRNONAA >90 06/13/2013 0614   GFRAA >90 06/13/2013 0614   Lipase  No results found for this basename: lipase       Studies/Results: No results found.  Anti-infectives: Anti-infectives   Start     Dose/Rate Route Frequency Ordered Stop   06/14/13 1400  metroNIDAZOLE (FLAGYL) tablet 500 mg  Status:  Discontinued     500 mg Oral 3 times per day 06/14/13 1019 06/14/13 1021   06/14/13 1000   amoxicillin-clavulanate (AUGMENTIN) 875-125 MG per tablet 1 tablet     1 tablet Oral Every 12 hours 06/14/13 0847     06/10/13 2200  cefTRIAXone (ROCEPHIN) 1 g in dextrose 5 % 50 mL IVPB  Status:  Discontinued     1 g 100 mL/hr over 30 Minutes Intravenous Every 24 hours 06/10/13 2051 06/14/13 0847   06/10/13 2200  metroNIDAZOLE (FLAGYL) IVPB 500 mg  Status:  Discontinued     500 mg 100 mL/hr over 60 Minutes Intravenous Every 8 hours 06/10/13 2051 06/14/13 1018       Assessment/Plan  1. Acute cholecystitis and liver abscess 2. S/p perc chole drain  Plan: 1. 14 days total of abx therapy.  Should dc home on Augmentin 2. Cont perc chole drain. Will follow up with us in 3-4 weeks. 2. May return to work in 2 weeks   LOS: 5 days    Vincent Rios 06/15/2013, 8:02 AM Pager: [phone]

## 2013-06-15 NOTE — Progress Notes (Signed)
Agree with above. Pt seen and examined. Home with po antibiotics and perc chole drain for cholecystitis. Will see who correct surgeon is for follow up.

## 2013-06-16 LAB — ANAEROBIC CULTURE: Gram Stain: NONE SEEN

## 2013-06-17 LAB — CULTURE, BLOOD (ROUTINE X 2)
Culture: NO GROWTH
Culture: NO GROWTH

## 2013-07-06 ENCOUNTER — Ambulatory Visit (INDEPENDENT_AMBULATORY_CARE_PROVIDER_SITE_OTHER): Payer: BC Managed Care – PPO | Admitting: General Surgery

## 2013-07-06 ENCOUNTER — Encounter (INDEPENDENT_AMBULATORY_CARE_PROVIDER_SITE_OTHER): Payer: Self-pay | Admitting: General Surgery

## 2013-07-06 VITALS — BP 168/94 | HR 84 | Resp 16 | Ht 74.0 in | Wt 257.6 lb

## 2013-07-06 DIAGNOSIS — K8000 Calculus of gallbladder with acute cholecystitis without obstruction: Secondary | ICD-10-CM

## 2013-07-06 NOTE — Progress Notes (Signed)
Subjective:     Patient ID: Vincent Rios, male   DOB: 04-24-1955, 58 y.o.   MRN: [id number]  HPI Patient is status post percutaneous cholecystostomy tube for acute cholecystitis and a liver abscess. He is doing well with no fevers or chills.  Review of Systems No fevers chills nausea vomiting.    Objective:   Physical Exam The percutaneous cholecystostomy tube is coming out the patient's right flank. There is minimal erythema around the tube. It is draining bile of 250-200 cc a day minimally symptomatic.    Assessment:     Status post percutaneous cholecystostomy tube. The patient needs a cholecystectomy.     Plan:     We'll go ahead and schedule for laparoscopic cholecystectomy possible open procedure. The risk and benefits have been explained to the patient and his wife and they wish to proceed. He also would like to go back to work on before surgery is scheduled and I will liberalize his disability. The only thing that he has to be aware of the be protective of existing cholecystostomy tube. As no lifting walking standing or bending restrictions.

## 2013-07-14 ENCOUNTER — Other Ambulatory Visit (HOSPITAL_COMMUNITY): Payer: Self-pay | Admitting: *Deleted

## 2013-07-14 ENCOUNTER — Encounter (HOSPITAL_COMMUNITY): Payer: Self-pay

## 2013-07-14 ENCOUNTER — Encounter (HOSPITAL_COMMUNITY)
Admission: RE | Admit: 2013-07-14 | Discharge: 2013-07-14 | Disposition: A | Discharge status: Home / Self Care | Payer: BC Managed Care – PPO | Source: Ambulatory Visit | Attending: General Surgery | Admitting: General Surgery

## 2013-07-14 DIAGNOSIS — Z01818 Encounter for other preprocedural examination: Secondary | ICD-10-CM | POA: Insufficient documentation

## 2013-07-14 DIAGNOSIS — Z01812 Encounter for preprocedural laboratory examination: Secondary | ICD-10-CM | POA: Insufficient documentation

## 2013-07-14 HISTORY — DX: Adverse effect of unspecified anesthetic, initial encounter: T41.45XA

## 2013-07-14 HISTORY — DX: Gastro-esophageal reflux disease without esophagitis: K21.9

## 2013-07-14 HISTORY — DX: Other seasonal allergic rhinitis: J30.2

## 2013-07-14 HISTORY — DX: Other complications of anesthesia, initial encounter: T88.59XA

## 2013-07-14 LAB — CBC WITH DIFFERENTIAL/PLATELET
Basophils Absolute: 0.1 10*3/uL (ref 0.0–0.1)
Basophils Relative: 1 % (ref 0–1)
Eosinophils Absolute: 0.6 10*3/uL (ref 0.0–0.7)
Eosinophils Relative: 8 % — ABNORMAL HIGH (ref 0–5)
HCT: 48.3 % (ref 39.0–52.0)
Hemoglobin: 16.5 g/dL (ref 13.0–17.0)
Lymphocytes Relative: 19 % (ref 12–46)
Lymphs Abs: 1.6 10*3/uL (ref 0.7–4.0)
MCH: 28.5 pg (ref 26.0–34.0)
MCHC: 34.2 g/dL (ref 30.0–36.0)
MCV: 83.4 fL (ref 78.0–100.0)
Monocytes Absolute: 0.6 10*3/uL (ref 0.1–1.0)
Monocytes Relative: 8 % (ref 3–12)
Neutro Abs: 5.2 10*3/uL (ref 1.7–7.7)
Neutrophils Relative %: 64 % (ref 43–77)
Platelets: 144 10*3/uL — ABNORMAL LOW (ref 150–400)
RBC: 5.79 MIL/uL (ref 4.22–5.81)
RDW: 13.7 % (ref 11.5–15.5)
WBC: 8.1 10*3/uL (ref 4.0–10.5)

## 2013-07-14 LAB — COMPREHENSIVE METABOLIC PANEL
ALT: 37 U/L (ref 0–53)
AST: 29 U/L (ref 0–37)
Albumin: 4.2 g/dL (ref 3.5–5.2)
Alkaline Phosphatase: 91 U/L (ref 39–117)
BUN: 17 mg/dL (ref 6–23)
CO2: 26 mEq/L (ref 19–32)
Calcium: 10.2 mg/dL (ref 8.4–10.5)
Chloride: 101 mEq/L (ref 96–112)
Creatinine, Ser: 1 mg/dL (ref 0.50–1.35)
GFR calc Af Amer: >90 mL/min (ref >90)
GFR calc non Af Amer: 81 mL/min — ABNORMAL LOW (ref >90)
Glucose, Bld: 102 mg/dL — ABNORMAL HIGH (ref 70–99)
Potassium: 4.4 mEq/L (ref 3.7–5.3)
Sodium: 142 mEq/L (ref 137–147)
Total Bilirubin: 0.7 mg/dL (ref 0.3–1.2)
Total Protein: 7.9 g/dL (ref 6.0–8.3)

## 2013-07-14 NOTE — Pre-Procedure Instructions (Addendum)
Vincent Rios  07/14/2013   Your procedure is scheduled on:  Monday, Jul 19, 2013 at 11:30 AM.   [facility] Admitting Office at 9:30 AM.   Call this number if you have problems the morning of surgery: [phone]   Remember:   Do not eat food or drink liquids after midnight Sunday, 07/18/13.   Take these medicines the morning of surgery with A SIP OF WATER:  Pantoprazole ( Protonix)   Stop Aspirin, Coenzyme Q10, Omega-3, Vitamins, Herbal Medication and Nsaids as of today 07/14/13.  Do not wear jewelry.  Do not wear lotions, powders, or cologne. You may wear deodorant.  Men may shave face and neck.  Do not bring valuables to the hospital.  [facility] is not responsible for any belongings or valuables.               Contacts, dentures or bridgework may not be worn into surgery.  Leave suitcase in the car. After surgery it may be brought to your room.  For patients admitted to the hospital, discharge time is determined by your treatment team.               Special Instructions: [facility] - Preparing for Surgery  Before surgery, you can play an important role.  Because skin is not sterile, your skin needs to be as free of germs as possible.  You can reduce the number of germs on you skin by washing with CHG (chlorahexidine gluconate) soap before surgery.  CHG is an antiseptic cleaner which kills germs and bonds with the skin to continue killing germs even after washing.  Please DO NOT use if you have an allergy to CHG or antibacterial soaps.  If your skin becomes reddened/irritated stop using the CHG and inform your nurse when you arrive at Short Stay.  Do not shave (including legs and underarms) for at least 48 hours prior to the first CHG shower.  You may shave your face.  Please follow these instructions carefully:   1.  Shower with CHG Soap the night before surgery and the                                morning of Surgery.  2.  If you choose to  wash your hair, wash your hair first as usual with your       normal shampoo.  3.  After you shampoo, rinse your hair and body thoroughly to remove the                      Shampoo.  4.  Use CHG as you would any other liquid soap.  You can apply chg directly       to the skin and wash gently with scrungie or a clean washcloth.  5.  Apply the CHG Soap to your body ONLY FROM THE NECK DOWN.        Do not use on open wounds or open sores.  Avoid contact with your eyes, ears, mouth and genitals (private parts).  Wash genitals (private parts) with your normal soap.  6.  Wash thoroughly, paying special attention to the area where your surgery        will be performed.  7.  Thoroughly rinse your body with warm water from the neck down.  8.  DO NOT shower/wash with your normal soap after using  and rinsing off       the CHG Soap.  9.  Pat yourself dry with a clean towel.            10.  Wear clean pajamas.            11.  Place clean sheets on your bed the night of your first shower and do not        sleep with pets.  Day of Surgery  Do not apply any lotions the morning of surgery.  Please wear clean clothes to the hospital/surgery center.     Please read over the following fact sheets that you were given: Pain Booklet, Coughing and Deep Breathing and Surgical Site Infection Prevention

## 2013-07-15 ENCOUNTER — Telehealth (INDEPENDENT_AMBULATORY_CARE_PROVIDER_SITE_OTHER): Payer: Self-pay

## 2013-07-15 NOTE — Telephone Encounter (Signed)
Pt was prescribed protonix by Dr Benjamine MolaVann when he was originally admitted to hospital. There was no refill on prescription. He took last pill last night. Pre op told him he needs to get a refill because he should not stop taking the medication before his surgery. Since Dr Benjamine MolaVann is hospital doctor, they have not been able to get an answer for a refill. She is calling asking Dr Lindie SpruceWyatt for refill if he needs to be on protonix until his surgery. Please advise.Marland Kitchen..Marland Kitchen

## 2013-07-16 NOTE — Telephone Encounter (Signed)
Tried calling pt wife back but no answer or VM to leave msg. Per Dr Lindie SpruceWyatt, pt does not need to still be taking protonix from a surgical standpoint.

## 2013-07-18 MED ORDER — DEXTROSE 5 % IV SOLN
2.0000 g | INTRAVENOUS | Status: AC
  Administered 2013-07-19: 2 g via INTRAVENOUS
  Filled 2013-07-18: qty 2

## 2013-07-19 ENCOUNTER — Encounter (HOSPITAL_COMMUNITY): Payer: Self-pay | Admitting: *Deleted

## 2013-07-19 ENCOUNTER — Ambulatory Visit (HOSPITAL_COMMUNITY): Payer: BC Managed Care – PPO

## 2013-07-19 ENCOUNTER — Encounter (HOSPITAL_COMMUNITY)
Admission: RE | Disposition: A | Discharge status: Home / Self Care | Payer: Self-pay | Source: Ambulatory Visit | Attending: General Surgery

## 2013-07-19 ENCOUNTER — Ambulatory Visit (HOSPITAL_COMMUNITY): Payer: BC Managed Care – PPO | Admitting: Anesthesiology

## 2013-07-19 ENCOUNTER — Ambulatory Visit (HOSPITAL_COMMUNITY)
Admission: RE | Admit: 2013-07-19 | Discharge: 2013-07-19 | Disposition: A | Discharge status: Home / Self Care | Payer: BC Managed Care – PPO | Source: Ambulatory Visit | Attending: General Surgery | Admitting: General Surgery

## 2013-07-19 ENCOUNTER — Encounter (HOSPITAL_COMMUNITY): Payer: BC Managed Care – PPO | Admitting: Anesthesiology

## 2013-07-19 DIAGNOSIS — K8000 Calculus of gallbladder with acute cholecystitis without obstruction: Secondary | ICD-10-CM | POA: Insufficient documentation

## 2013-07-19 DIAGNOSIS — K8042 Calculus of bile duct with acute cholecystitis without obstruction: Secondary | ICD-10-CM

## 2013-07-19 HISTORY — PX: CHOLECYSTECTOMY: SHX55

## 2013-07-19 HISTORY — PX: INTRAOPERATIVE CHOLANGIOGRAM: SHX5230

## 2013-07-19 LAB — GLUCOSE, CAPILLARY: Glucose-Capillary: 118 mg/dL — ABNORMAL HIGH (ref 70–99)

## 2013-07-19 SURGERY — LAPAROSCOPIC CHOLECYSTECTOMY
Anesthesia: General | Site: Abdomen

## 2013-07-19 MED ORDER — BUPIVACAINE-EPINEPHRINE (PF) 0.25% -1:200000 IJ SOLN
INTRAMUSCULAR | Status: AC
  Filled 2013-07-19: qty 30

## 2013-07-19 MED ORDER — GLYCOPYRROLATE 0.2 MG/ML IJ SOLN
INTRAMUSCULAR | Status: AC
  Filled 2013-07-19: qty 4

## 2013-07-19 MED ORDER — IOHEXOL 300 MG/ML  SOLN
INTRAMUSCULAR | Status: DC | PRN
  Administered 2013-07-19: 50 mL

## 2013-07-19 MED ORDER — ESMOLOL HCL 10 MG/ML IV SOLN
INTRAVENOUS | Status: AC
  Filled 2013-07-19: qty 10

## 2013-07-19 MED ORDER — FENTANYL CITRATE 0.05 MG/ML IJ SOLN
INTRAMUSCULAR | Status: DC | PRN
  Administered 2013-07-19: 100 ug via INTRAVENOUS
  Administered 2013-07-19 (×3): 50 ug via INTRAVENOUS

## 2013-07-19 MED ORDER — PROPOFOL 10 MG/ML IV BOLUS
INTRAVENOUS | Status: DC | PRN
  Administered 2013-07-19: 50 mg via INTRAVENOUS
  Administered 2013-07-19: 180 mg via INTRAVENOUS

## 2013-07-19 MED ORDER — ONDANSETRON HCL 4 MG/2ML IJ SOLN: 4.0000 mg | Freq: Once | INTRAMUSCULAR | Status: DC | PRN

## 2013-07-19 MED ORDER — ONDANSETRON HCL 4 MG/2ML IJ SOLN
INTRAMUSCULAR | Status: DC | PRN
  Administered 2013-07-19: 4 mg via INTRAVENOUS

## 2013-07-19 MED ORDER — ROCURONIUM BROMIDE 50 MG/5ML IV SOLN
INTRAVENOUS | Status: AC
  Filled 2013-07-19: qty 1

## 2013-07-19 MED ORDER — GLYCOPYRROLATE 0.2 MG/ML IJ SOLN
INTRAMUSCULAR | Status: DC | PRN
  Administered 2013-07-19: 0.4 mg via INTRAVENOUS
  Administered 2013-07-19 (×2): 0.2 mg via INTRAVENOUS

## 2013-07-19 MED ORDER — HYDROMORPHONE HCL PF 1 MG/ML IJ SOLN: 0.2500 mg | INTRAMUSCULAR | Status: DC | PRN

## 2013-07-19 MED ORDER — PROPOFOL 10 MG/ML IV BOLUS
INTRAVENOUS | Status: AC
  Filled 2013-07-19: qty 20

## 2013-07-19 MED ORDER — EPHEDRINE SULFATE 50 MG/ML IJ SOLN
INTRAMUSCULAR | Status: DC | PRN
Start: 2013-07-19 — End: 2013-07-19
  Administered 2013-07-19 (×2): 10 mg via INTRAVENOUS

## 2013-07-19 MED ORDER — EPHEDRINE SULFATE 50 MG/ML IJ SOLN
INTRAMUSCULAR | Status: AC
  Filled 2013-07-19: qty 1

## 2013-07-19 MED ORDER — SODIUM CHLORIDE 0.9 % IJ SOLN
INTRAMUSCULAR | Status: AC
  Filled 2013-07-19: qty 10

## 2013-07-19 MED ORDER — NEOSTIGMINE METHYLSULFATE 10 MG/10ML IV SOLN
INTRAVENOUS | Status: DC | PRN
  Administered 2013-07-19: 3 mg via INTRAVENOUS

## 2013-07-19 MED ORDER — MIDAZOLAM HCL 5 MG/5ML IJ SOLN
INTRAMUSCULAR | Status: DC | PRN
  Administered 2013-07-19: 2 mg via INTRAVENOUS

## 2013-07-19 MED ORDER — OXYCODONE HCL 5 MG/5ML PO SOLN: 5.0000 mg | Freq: Once | ORAL | Status: AC | PRN

## 2013-07-19 MED ORDER — ESMOLOL HCL 10 MG/ML IV SOLN
INTRAVENOUS | Status: DC | PRN
  Administered 2013-07-19: 30 mg via INTRAVENOUS
  Administered 2013-07-19: 20 mg via INTRAVENOUS

## 2013-07-19 MED ORDER — LIDOCAINE HCL (CARDIAC) 20 MG/ML IV SOLN
INTRAVENOUS | Status: AC
  Filled 2013-07-19: qty 5

## 2013-07-19 MED ORDER — BUPIVACAINE-EPINEPHRINE 0.25% -1:200000 IJ SOLN
INTRAMUSCULAR | Status: DC | PRN
  Administered 2013-07-19: 30 mL

## 2013-07-19 MED ORDER — LACTATED RINGERS IV SOLN
INTRAVENOUS | Status: DC
  Administered 2013-07-19 (×2): via INTRAVENOUS

## 2013-07-19 MED ORDER — SODIUM CHLORIDE 0.9 % IR SOLN
Status: DC | PRN
  Administered 2013-07-19: 1000 mL

## 2013-07-19 MED ORDER — ONDANSETRON HCL 4 MG/2ML IJ SOLN
INTRAMUSCULAR | Status: AC
  Filled 2013-07-19: qty 2

## 2013-07-19 MED ORDER — OXYCODONE HCL 5 MG PO TABS
5.0000 mg | ORAL_TABLET | Freq: Once | ORAL | Status: AC | PRN
  Administered 2013-07-19: 5 mg via ORAL

## 2013-07-19 MED ORDER — LIDOCAINE HCL (CARDIAC) 20 MG/ML IV SOLN
INTRAVENOUS | Status: DC | PRN
  Administered 2013-07-19: 50 mg via INTRAVENOUS

## 2013-07-19 MED ORDER — OXYCODONE-ACETAMINOPHEN 5-325 MG PO TABS: 1.0000 | ORAL_TABLET | ORAL | Status: DC | PRN

## 2013-07-19 MED ORDER — NEOSTIGMINE METHYLSULFATE 10 MG/10ML IV SOLN
INTRAVENOUS | Status: AC
  Filled 2013-07-19: qty 1

## 2013-07-19 MED ORDER — PHENYLEPHRINE HCL 10 MG/ML IJ SOLN
INTRAMUSCULAR | Status: DC | PRN
  Administered 2013-07-19: 80 ug via INTRAVENOUS

## 2013-07-19 MED ORDER — MIDAZOLAM HCL 2 MG/2ML IJ SOLN
INTRAMUSCULAR | Status: AC
  Filled 2013-07-19: qty 2

## 2013-07-19 MED ORDER — ROCURONIUM BROMIDE 100 MG/10ML IV SOLN
INTRAVENOUS | Status: DC | PRN
  Administered 2013-07-19: 50 mg via INTRAVENOUS

## 2013-07-19 MED ORDER — FENTANYL CITRATE 0.05 MG/ML IJ SOLN
INTRAMUSCULAR | Status: AC
  Filled 2013-07-19: qty 5

## 2013-07-19 MED ORDER — 0.9 % SODIUM CHLORIDE (POUR BTL) OPTIME
TOPICAL | Status: DC | PRN
  Administered 2013-07-19: 1000 mL

## 2013-07-19 MED ORDER — OXYCODONE HCL 5 MG PO TABS
ORAL_TABLET | ORAL | Status: AC
  Filled 2013-07-19: qty 1

## 2013-07-19 SURGICAL SUPPLY — 45 items
APPLIER CLIP ROT 10 11.4 M/L (STAPLE) ×2
BLADE SURG ROTATE 9660 (MISCELLANEOUS) ×2 IMPLANT
CANISTER SUCTION 2500CC (MISCELLANEOUS) ×2 IMPLANT
CHLORAPREP W/TINT 26ML (MISCELLANEOUS) ×2 IMPLANT
CLIP APPLIE ROT 10 11.4 M/L (STAPLE) ×1 IMPLANT
CLSR STERI-STRIP ANTIMIC 1/2X4 (GAUZE/BANDAGES/DRESSINGS) ×2 IMPLANT
COVER MAYO STAND STRL (DRAPES) IMPLANT
COVER SURGICAL LIGHT HANDLE (MISCELLANEOUS) ×2 IMPLANT
DERMABOND ADVANCED (GAUZE/BANDAGES/DRESSINGS) ×1
DERMABOND ADVANCED .7 DNX12 (GAUZE/BANDAGES/DRESSINGS) ×1 IMPLANT
DRAPE C-ARM 42X72 X-RAY (DRAPES) ×2 IMPLANT
DRAPE UTILITY 15X26 W/TAPE STR (DRAPE) ×4 IMPLANT
DRSG TEGADERM 2-3/8X2-3/4 SM (GAUZE/BANDAGES/DRESSINGS) ×4 IMPLANT
ELECT REM PT RETURN 9FT ADLT (ELECTROSURGICAL) ×2
ELECTRODE REM PT RTRN 9FT ADLT (ELECTROSURGICAL) ×1 IMPLANT
GAUZE SPONGE 2X2 8PLY STRL LF (GAUZE/BANDAGES/DRESSINGS) ×1 IMPLANT
GLOVE BIOGEL PI IND STRL 7.0 (GLOVE) ×1 IMPLANT
GLOVE BIOGEL PI IND STRL 7.5 (GLOVE) ×2 IMPLANT
GLOVE BIOGEL PI IND STRL 8 (GLOVE) ×2 IMPLANT
GLOVE BIOGEL PI INDICATOR 7.0 (GLOVE) ×1
GLOVE BIOGEL PI INDICATOR 7.5 (GLOVE) ×2
GLOVE BIOGEL PI INDICATOR 8 (GLOVE) ×2
GLOVE ECLIPSE 7.5 STRL STRAW (GLOVE) ×4 IMPLANT
GLOVE ECLIPSE 8.0 STRL XLNG CF (GLOVE) ×2 IMPLANT
GLOVE SURG SS PI 7.0 STRL IVOR (GLOVE) ×2 IMPLANT
GOWN STRL REUS W/ TWL LRG LVL3 (GOWN DISPOSABLE) ×3 IMPLANT
GOWN STRL REUS W/TWL LRG LVL3 (GOWN DISPOSABLE) ×3
KIT BASIN OR (CUSTOM PROCEDURE TRAY) ×2 IMPLANT
KIT ROOM TURNOVER OR (KITS) ×2 IMPLANT
NS IRRIG 1000ML POUR BTL (IV SOLUTION) ×2 IMPLANT
PAD ARMBOARD 7.5X6 YLW CONV (MISCELLANEOUS) ×2 IMPLANT
POUCH SPECIMEN RETRIEVAL 10MM (ENDOMECHANICALS) ×2 IMPLANT
SCISSORS LAP 5X35 DISP (ENDOMECHANICALS) ×2 IMPLANT
SET CHOLANGIOGRAPH 5 50 .035 (SET/KITS/TRAYS/PACK) ×2 IMPLANT
SET IRRIG TUBING LAPAROSCOPIC (IRRIGATION / IRRIGATOR) ×2 IMPLANT
SLEEVE ENDOPATH XCEL 5M (ENDOMECHANICALS) ×2 IMPLANT
SPECIMEN JAR SMALL (MISCELLANEOUS) ×2 IMPLANT
SPONGE GAUZE 2X2 STER 10/PKG (GAUZE/BANDAGES/DRESSINGS) ×1
SUT MNCRL AB 4-0 PS2 18 (SUTURE) ×2 IMPLANT
SUT VICRYL 0 UR6 27IN ABS (SUTURE) ×2 IMPLANT
TOWEL OR 17X26 10 PK STRL BLUE (TOWEL DISPOSABLE) ×2 IMPLANT
TRAY LAPAROSCOPIC (CUSTOM PROCEDURE TRAY) ×2 IMPLANT
TROCAR XCEL BLUNT TIP 100MML (ENDOMECHANICALS) ×2 IMPLANT
TROCAR XCEL NON-BLD 11X100MML (ENDOMECHANICALS) ×2 IMPLANT
TROCAR XCEL NON-BLD 5MMX100MML (ENDOMECHANICALS) ×2 IMPLANT

## 2013-07-19 NOTE — Anesthesia Preprocedure Evaluation (Signed)
Anesthesia Evaluation  Patient identified by MRN, date of birth, ID band Patient awake    Reviewed: Allergy & Precautions, H&P , NPO status , Patient's Chart, lab work & pertinent test results  Airway Mallampati: II TM Distance: >3 FB Neck ROM: Full    Dental  (+) Teeth Intact, Dental Advisory Given   Pulmonary former smoker,  breath sounds clear to auscultation        Cardiovascular hypertension, Rhythm:Regular Rate:Normal     Neuro/Psych    GI/Hepatic   Endo/Other    Renal/GU      Musculoskeletal   Abdominal (+) + obese,   Peds  Hematology   Anesthesia Other Findings   Reproductive/Obstetrics                           Anesthesia Physical Anesthesia Plan  ASA: III  Anesthesia Plan: General   Post-op Pain Management:    Induction: Intravenous  Airway Management Planned: Oral ETT  Additional Equipment:   Intra-op Plan:   Post-operative Plan: Extubation in OR  Informed Consent: I have reviewed the patients History and Physical, chart, labs and discussed the procedure including the risks, benefits and alternatives for the proposed anesthesia with the patient or authorized representative who has indicated his/her understanding and acceptance.   Dental advisory given  Plan Discussed with: CRNA and Anesthesiologist  Anesthesia Plan Comments: (cholelithisis with chole tube inserted for acute cholecystitis and liver abscess 4/3 Htn Borderline DM glucose 118)        Anesthesia Quick Evaluation

## 2013-07-19 NOTE — Anesthesia Postprocedure Evaluation (Signed)
  Anesthesia Post-op Note  Patient: Vincent Rios  Procedure(s) Performed: Procedure(s): LAPAROSCOPIC CHOLECYSTECTOMY  (N/A) INTRAOPERATIVE CHOLANGIOGRAM (N/A)  Patient Location: PACU  Anesthesia Type:General  Level of Consciousness: awake, alert  and oriented  Airway and Oxygen Therapy: Patient Spontanous Breathing and Patient connected to nasal cannula oxygen  Post-op Pain: mild  Post-op Assessment: Post-op Vital signs reviewed, Patient's Cardiovascular Status Stable, Respiratory Function Stable, Patent Airway and Pain level controlled  Post-op Vital Signs: stable  Last Vitals:  Filed Vitals:   07/19/13 1435  BP: 156/98  Pulse: 98  Temp:   Resp: 20    Complications: No apparent anesthesia complications

## 2013-07-19 NOTE — Discharge Instructions (Signed)
Laparoscopic Cholecystectomy, Care After °Refer to this sheet in the next few weeks. These instructions provide you with information on caring for yourself after your procedure. Your health care provider may also give you more specific instructions. Your treatment has been planned according to current medical practices, but problems sometimes occur. Call your health care provider if you have any problems or questions after your procedure. °WHAT TO EXPECT AFTER THE PROCEDURE °After your procedure, it is typical to have the following: °· Pain at your incision sites. You will be given pain medicines to control the pain. °· Mild nausea or vomiting. This should improve after the first 24 hours. °· Bloating and possibly shoulder pain from the gas used during the procedure. This will improve after the first 24 hours. °HOME CARE INSTRUCTIONS  °· Change bandages (dressings) as directed by your health care provider. °· Keep the wound dry and clean. You may wash the wound gently with soap and water. Gently blot or dab the area dry. °· Do not take baths or use swimming pools or hot tubs for 2 weeks or until your health care provider approves. °· Only take over-the-counter or prescription medicines as directed by your health care provider. °· Continue your normal diet as directed by your health care provider. °· Do not lift anything heavier than 10 pounds (4.5 kg) until your health care provider approves. °· Do not play contact sports for 1 week or until your health care provider approves. °SEEK MEDICAL CARE IF:  °· You have redness, swelling, or increasing pain in the wound. °· You notice yellowish-white fluid (pus) coming from the wound. °· You have drainage from the wound that lasts longer than 1 day. °· You notice a bad smell coming from the wound or dressing. °· Your surgical cuts (incisions) break open. °SEEK IMMEDIATE MEDICAL CARE IF:  °· You develop a rash. °· You have difficulty breathing. °· You have chest pain. °· You  have a fever. °· You have increasing pain in the shoulders (shoulder strap areas). °· You have dizzy episodes or faint while standing. °· You have severe abdominal pain. °· You feel sick to your stomach (nauseous) or throw up (vomit) and this lasts for more than 1 day. °Document Released: 02/25/2005 Document Revised: 12/16/2012 Document Reviewed: 10/07/2012 °[employer]® Patient Information ©2014 [employer]. ° °

## 2013-07-19 NOTE — Interval H&P Note (Signed)
History and Physical Interval Note: Patient is doing fine with the percutaneous drain.  Abscess was part of the infected GB wall.  Should be able to perform lap chole with IOC. 07/19/2013 10:53 AM  Vincent Rios  has presented today for surgery, with the diagnosis of Acute cholecystitis with drain  The various methods of treatment have been discussed with the patient and family. After consideration of risks, benefits and other options for treatment, the patient has consented to  Procedure(s): LAPAROSCOPIC CHOLECYSTECTOMY, POSSIBLE OPEN (N/A) as a surgical intervention .  The patient's history has been reviewed, patient examined, no change in status, stable for surgery.  I have reviewed the patient's chart and labs.  Questions were answered to the patient's satisfaction.     Cherylynn RidgesJames O Shaterica Mcclatchy

## 2013-07-19 NOTE — Transfer of Care (Signed)
Immediate Anesthesia Transfer of Care Note  Patient: Vincent Rios  Procedure(s) Performed: Procedure(s): LAPAROSCOPIC CHOLECYSTECTOMY  (N/A) INTRAOPERATIVE CHOLANGIOGRAM (N/A)  Patient Location: PACU  Anesthesia Type:General  Level of Consciousness: awake, alert  and oriented  Airway & Oxygen Therapy: Patient Spontanous Breathing and Patient connected to face mask oxygen  Post-op Assessment: Report given to PACU RN and Post -op Vital signs reviewed and stable  Post vital signs: Reviewed and stable  Complications: No apparent anesthesia complications

## 2013-07-19 NOTE — Anesthesia Procedure Notes (Signed)
Procedure Name: Intubation Date/Time: 07/19/2013 11:07 AM Performed by: Arlice ColtMANESS, Malyah Ohlrich B Pre-anesthesia Checklist: Patient identified, Emergency Drugs available, Suction available, Patient being monitored and Timeout performed Patient Re-evaluated:Patient Re-evaluated prior to inductionOxygen Delivery Method: Circle system utilized Preoxygenation: Pre-oxygenation with 100% oxygen Intubation Type: IV induction Ventilation: Oral airway inserted - appropriate to patient size and Two handed mask ventilation required Laryngoscope Size: Mac and 4 Grade View: Grade I Tube type: Oral Tube size: 7.5 mm Number of attempts: 1 Airway Equipment and Method: Stylet Placement Confirmation: ETT inserted through vocal cords under direct vision,  positive ETCO2 and breath sounds checked- equal and bilateral Secured at: 23 cm Tube secured with: Tape Dental Injury: Teeth and Oropharynx as per pre-operative assessment

## 2013-07-19 NOTE — Op Note (Addendum)
OPERATIVE REPORT  DATE OF OPERATION: 07/19/2013  PATIENT:  Vincent Rios  58 y.o. male  PRE-OPERATIVE DIAGNOSIS:  Acute cholecystitis with drain  POST-OPERATIVE DIAGNOSIS:  Acute cholecystitis  PROCEDURE:  Procedure(s): LAPAROSCOPIC CHOLECYSTECTOMY with Cholangiogram  SURGEON:  Surgeon(s): Cherylynn RidgesJames O Pearlean Sabina, MD  ASSISTANT: Rosenbower  ANESTHESIA:   general  EBL: <30 ml  BLOOD ADMINISTERED: none  DRAINS: none   SPECIMEN:  Source of Specimen:  Gallbladder  COUNTS CORRECT:  YES  PROCEDURE DETAILS: The patient was taken to the operating room and placed on the table in the supine position.  Prior to preparing the patient for the procedure the right flank percutaneous drain was removed without event. After an adequate endotracheal anesthetic was administered, the patient was prepped with ChloroPrep, and then draped in the usual manner exposing the entire abdomen laterally, inferiorly and up  to the costal margins.  After a proper timeout was performed including identifying the patient and the procedure to be performed, a supraumbilical 1.5cm midline incision was made using a #15 blade.  This was taken down to the fascia which had a small epigastric ventral defect which was exploited for entrance into the peritoneal cavity.  The edges of the fascia were tented up with Kocher clamps as the preperitoneal space was penetrated with a Kelly clamp into the peritoneum.  Once this was done, a pursestring suture of 0 Vicryl was passed around the fascial opening.  This was subsequently used to secure the [facility] which was passed into the peritoneal cavity.  Once the [facility] cann[facility] was in place, carbon dioxide gas was insufflated into the peritoneal cavity up to a maximal intra-abdominal pressure of 15mm Hg.The laparoscope, with attached camera and light source, was passed into the peritoneal cavity to visualize the direct insertion of two right upper quadrant 5mm cannulas, and a sup-xiphoid  11mm cannula.  Once all cannulas were in place, the dissection was begun.  Two ratcheted graspers were attached to the dome and infundibulum of the gallbladder and retracted towards the anterior abdominal wall and the right upper quadrant.  Using cautery attached to a dissecting forceps, the peritoneum overlaying the triangle of Chalot and the hepatoduodenal triangle was dissected away exposing the cystic duct and the cystic artery.  A clip was placed on the gallbladder side of the cystic duct, then a cholecytodochotomy made using the laparoscopic scissors.  Through the cholecystodochotomy a Cook catheter was passed to performed a cholangiogram.  The cholangiogram showed good flow into the duodenum, good proximal filling, no ductal dilatation, and no intraductal filling defects..  Once the cholangiogram was completed, the [facility] catheter was removed, and the distal cystic duct was clipped multiple times then transected.  The gallbladder was then dissected out of the hepatic bed without event.  It was retrieved from the abdomen (using an EndoCatch bag) without event.  Once the gallbladder was removed, the bed was inspected for hemostasis.  Once excellent hemostasis was obtained all gas and fluids were aspirated from above the liver, then the cannulas were removed.  The supraumbilical incision was closed using the pursestring suture which was in place.  0.25% bupivicaine with epinephrine was injected at all sites.  All 10mm or greater cannula sites were close using a running subcuticular stitch of 4-0 Monocryl.  5.470mm cannula sites were closed with Dermabond only.Steri-Strips and Tagaderm were used to complete the dressings at all sites.  At this point all needle, sponge, and instrument counts were correct.The patient was awakened from anesthesia and  taken to the PACU in stable condition.      PATIENT DISPOSITION:  PACU - hemodynamically stable.   Cherylynn RidgesJames O Rodina Pinales 5/11/201512:24 PM

## 2013-07-19 NOTE — H&P (View-Only) (Signed)
Subjective:     Patient ID: Vincent Rios, male   DOB: November 13, 1955, 58 y.o.   MRN: [id number]  HPI Patient is status post percutaneous cholecystostomy tube for acute cholecystitis and a liver abscess. He is doing well with no fevers or chills.  Review of Systems No fevers chills nausea vomiting.    Objective:   Physical Exam The percutaneous cholecystostomy tube is coming out the patient's right flank. There is minimal erythema around the tube. It is draining bile of 250-200 cc a day minimally symptomatic.    Assessment:     Status post percutaneous cholecystostomy tube. The patient needs a cholecystectomy.     Plan:     We'll go ahead and schedule for laparoscopic cholecystectomy possible open procedure. The risk and benefits have been explained to the patient and his wife and they wish to proceed. He also would like to go back to work on before surgery is scheduled and I will liberalize his disability. The only thing that he has to be aware of the be protective of existing cholecystostomy tube. As no lifting walking standing or bending restrictions.

## 2013-07-20 ENCOUNTER — Telehealth (INDEPENDENT_AMBULATORY_CARE_PROVIDER_SITE_OTHER): Payer: Self-pay

## 2013-07-20 NOTE — Telephone Encounter (Signed)
Message copied by Brennan BaileyBROOKS, Jary Louvier on Tue Jul 20, 2013  9:00 AM ------      Message from: Isaias SakaiGALLOWAY, WENDY K      Created: Mon Jul 19, 2013  4:54 PM      Regarding: Dr Lindie SpruceWyatt      Contact: [phone]       Need 1st po for sx on 07/19/13. Dr Lindie SpruceWyatt has 5/19 (1wk) and 6/2 (3wk) open. Please let me know which day. Pt want 07/27/13 but I think that may be too soon ------

## 2013-07-20 NOTE — Telephone Encounter (Signed)
Called pt and okayed po appt for next week. Also gave fax number for fitness of duty forms to be faxed to us.

## 2013-07-22 ENCOUNTER — Encounter (HOSPITAL_COMMUNITY): Payer: Self-pay | Admitting: General Surgery

## 2013-07-27 ENCOUNTER — Encounter (INDEPENDENT_AMBULATORY_CARE_PROVIDER_SITE_OTHER): Payer: Self-pay | Admitting: General Surgery

## 2013-07-27 ENCOUNTER — Ambulatory Visit (INDEPENDENT_AMBULATORY_CARE_PROVIDER_SITE_OTHER): Payer: BC Managed Care – PPO | Admitting: General Surgery

## 2013-07-27 VITALS — BP 142/84 | HR 76 | Temp 97.8°F | Ht 74.0 in | Wt 255.0 lb

## 2013-07-27 DIAGNOSIS — Z09 Encounter for follow-up examination after completed treatment for conditions other than malignant neoplasm: Secondary | ICD-10-CM | POA: Insufficient documentation

## 2013-07-27 NOTE — Progress Notes (Signed)
Subjective:     Patient ID: Vincent Rios, male   DOB: 1956-02-22, 58 y.o.   MRN: [id number]  HPI The patient is doing very well status post laparoscopic cholecystectomy for severe acute cholecystitis with liver abscess a percutaneous drain.  Review of Systems No fevers, chills, nausea, or vomiting. No jaundice. He is eating well.    Objective:   Physical Exam Abdomen: All his incision sites are healing well with no evidence of infection. He has normal active bowel sounds. He is nontender on examination. There are no masses.    Assessment:     Doing well status post laparoscopic cholecystectomy for severe acute cholecystitis.     Plan:     The patient may return to work on 08/03/2013. He can return to see me on an as-needed basis

## 2015-01-11 IMAGING — CR DG CHEST 1V PORT
1 series · 1 of 1 positions shown · non-contrast
Comparison: None.

CLINICAL DATA: Shortness of breath, abdominal pain and distention.

EXAM:
PORTABLE CHEST - 1 VIEW

[AP]
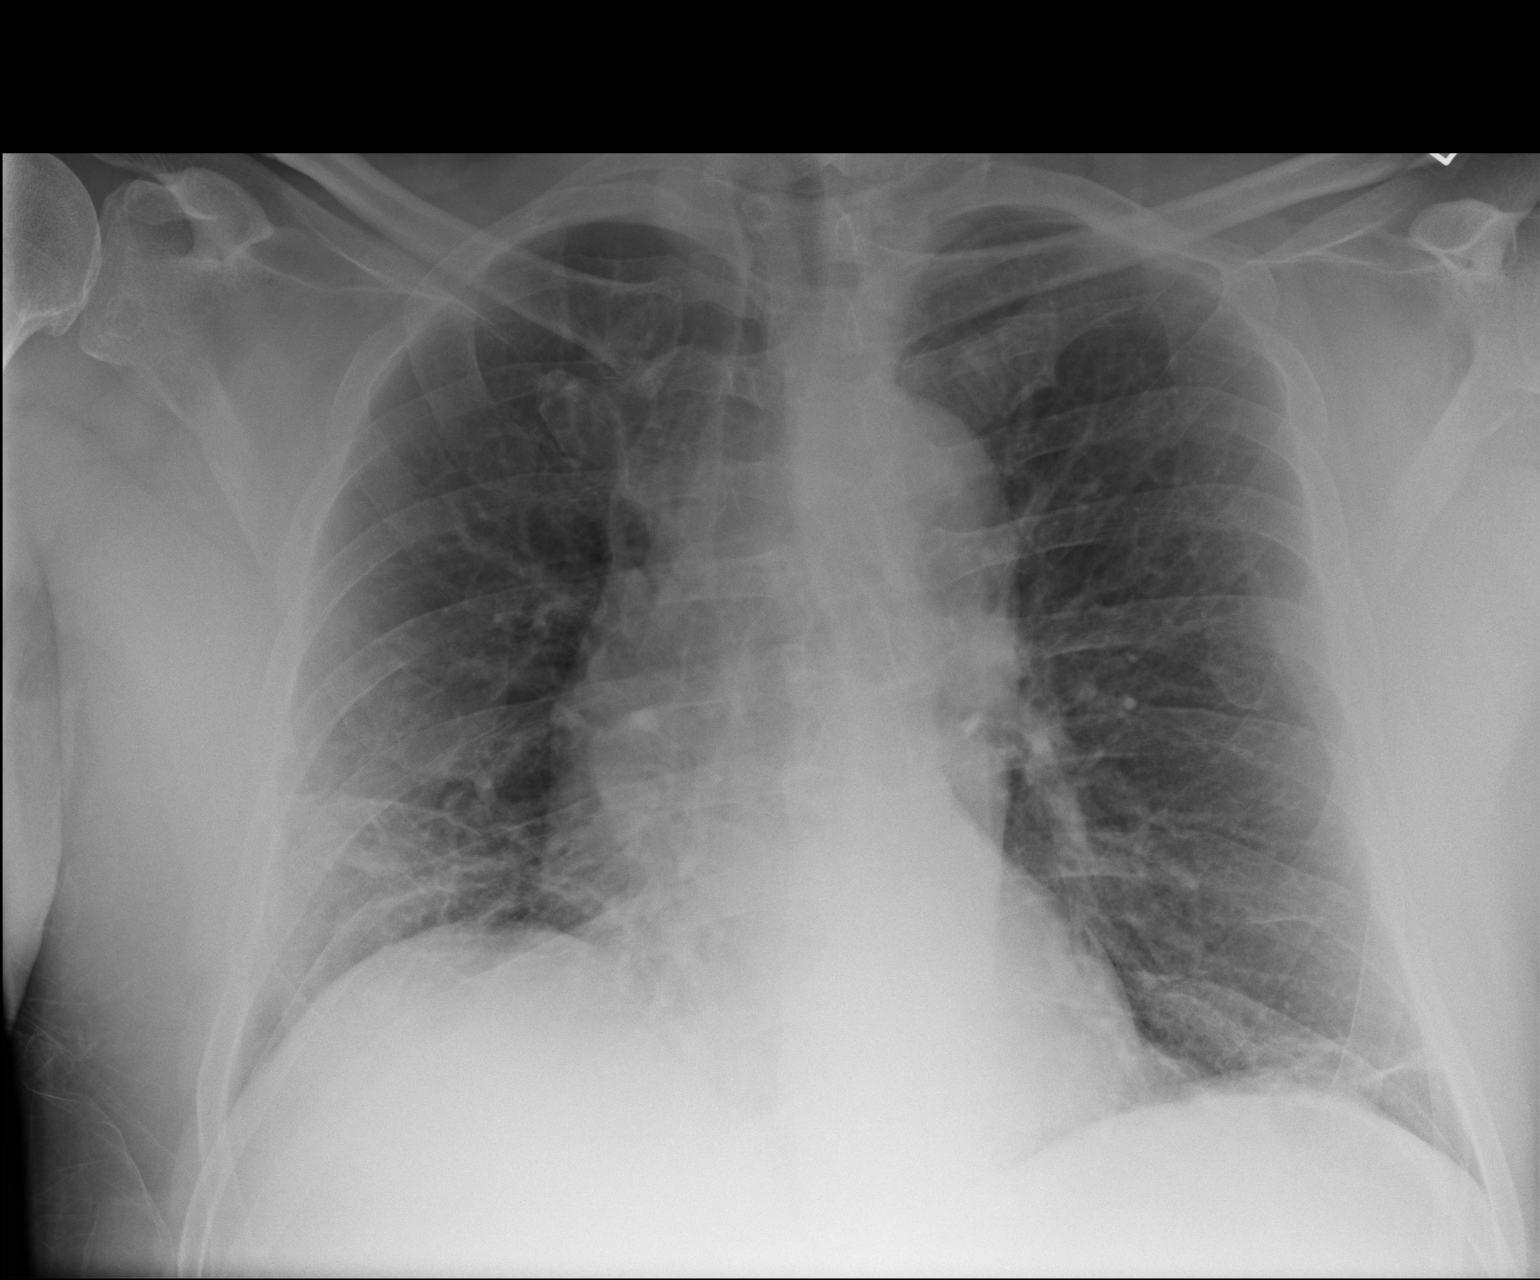

[1 of 1 positions shown; findings below may reference images not displayed]

FINDINGS: The lungs are well-aerated. Mild bibasilar opacities likely reflect
atelectasis. There is no evidence of pleural effusion or
pneumothorax.

The cardiomediastinal silhouette is within normal limits. No acute
osseous abnormalities are seen.
IMPRESSION: Mild bibasilar airspace opacities likely reflect atelectasis; lungs
otherwise clear.

## 2015-01-12 IMAGING — US IR CHOLECYSTOSTOMY
1 series · 2 of 2 positions shown · non-contrast
Comparison: none

CLINICAL DATA: ACUTE CHOLECYSTITIS, RIGHT UPPER QUADRANT PAIN,
POSSIBLE SMALL SECONDARY HEPATIC ABSCESSES

[Series 1: ir cholecystostomy · 2 of 2 slices shown]
[im 1/2]
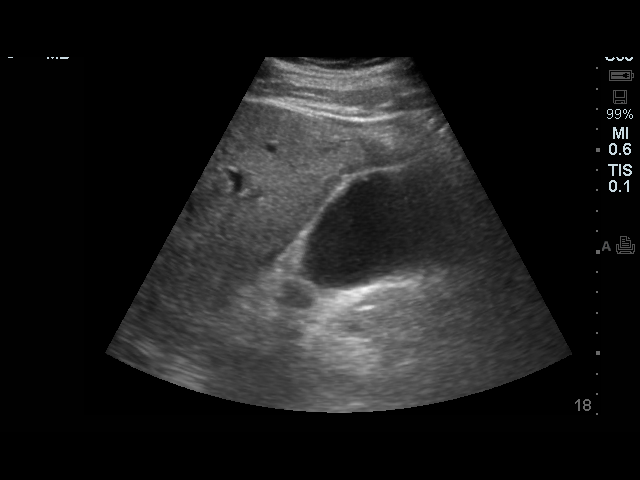
[im 2/2]
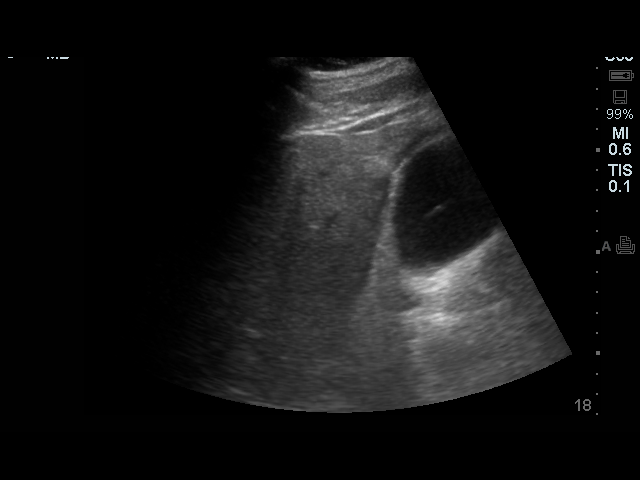

[2 of 2 positions shown; findings below may reference images not displayed]

EXAM:
ULTRASOUND AND FLUOROSCOPIC TRANSHEPATIC CHOLECYSTOSTOMY

Date:  [DATE] [DATE]

Radiologist:  Leffler, Kanika

Guidance:  Ultrasound and fluoroscopic

FLUOROSCOPY TIME:  1 min

MEDICATIONS AND MEDICAL HISTORY:
Patient is already receiving antibiotics currently, 4 mg versus a,
100 mcg fentanyl

ANESTHESIA/SEDATION:
15 min

CONTRAST:  10mL OMNIPAQUE IOHEXOL 300 MG/ML  SOLN

COMPLICATIONS:
No immediate

PROCEDURE:
Informed consent was obtained from the patient following explanation
of the procedure, risks, benefits and alternatives. The patient
understands, agrees and consents for the procedure. All questions
were addressed. A time out was performed.

Maximal barrier sterile technique utilized including caps, mask,
sterile gowns, sterile gloves, large sterile drape, hand hygiene,
and Betadine.

Previous imaging reviewed. Preliminary ultrasound performed of the
gallbladder in the right upper quadrant. Overlying skin was marked.
Under sterile conditions and local anesthesia, a 22 gauge access
needle was advanced under ultrasound through a transhepatic windows
into the gallbladder lumen. Needle position confirmed with
ultrasound. There was return of malodorous exudative bile. Sample
sent for Gram stain and culture. Guidewire inserted followed by
transitional dilator. Amplatz guidewire exchange performed. Tract
dilatation performed to advance a 10 French catheter. Retention loop
formed in the gallbladder lumen. 100 cc bile aspirated. Gallbladder
was decompressed. Contrast injection confirms position. Catheter
secured with a Prolene suture and connected to gravity drainage bag.
No immediate complication.
IMPRESSION: Successful fluoroscopic and ultrasound-guided 10 French transhepatic
cholecystostomy

## 2015-02-14 IMAGING — CR DG CHEST 2V
2 series · 2 of 2 positions shown · non-contrast
Comparison: None.

CLINICAL DATA: preop

EXAM:
CHEST  2 VIEW

[w chest pa]
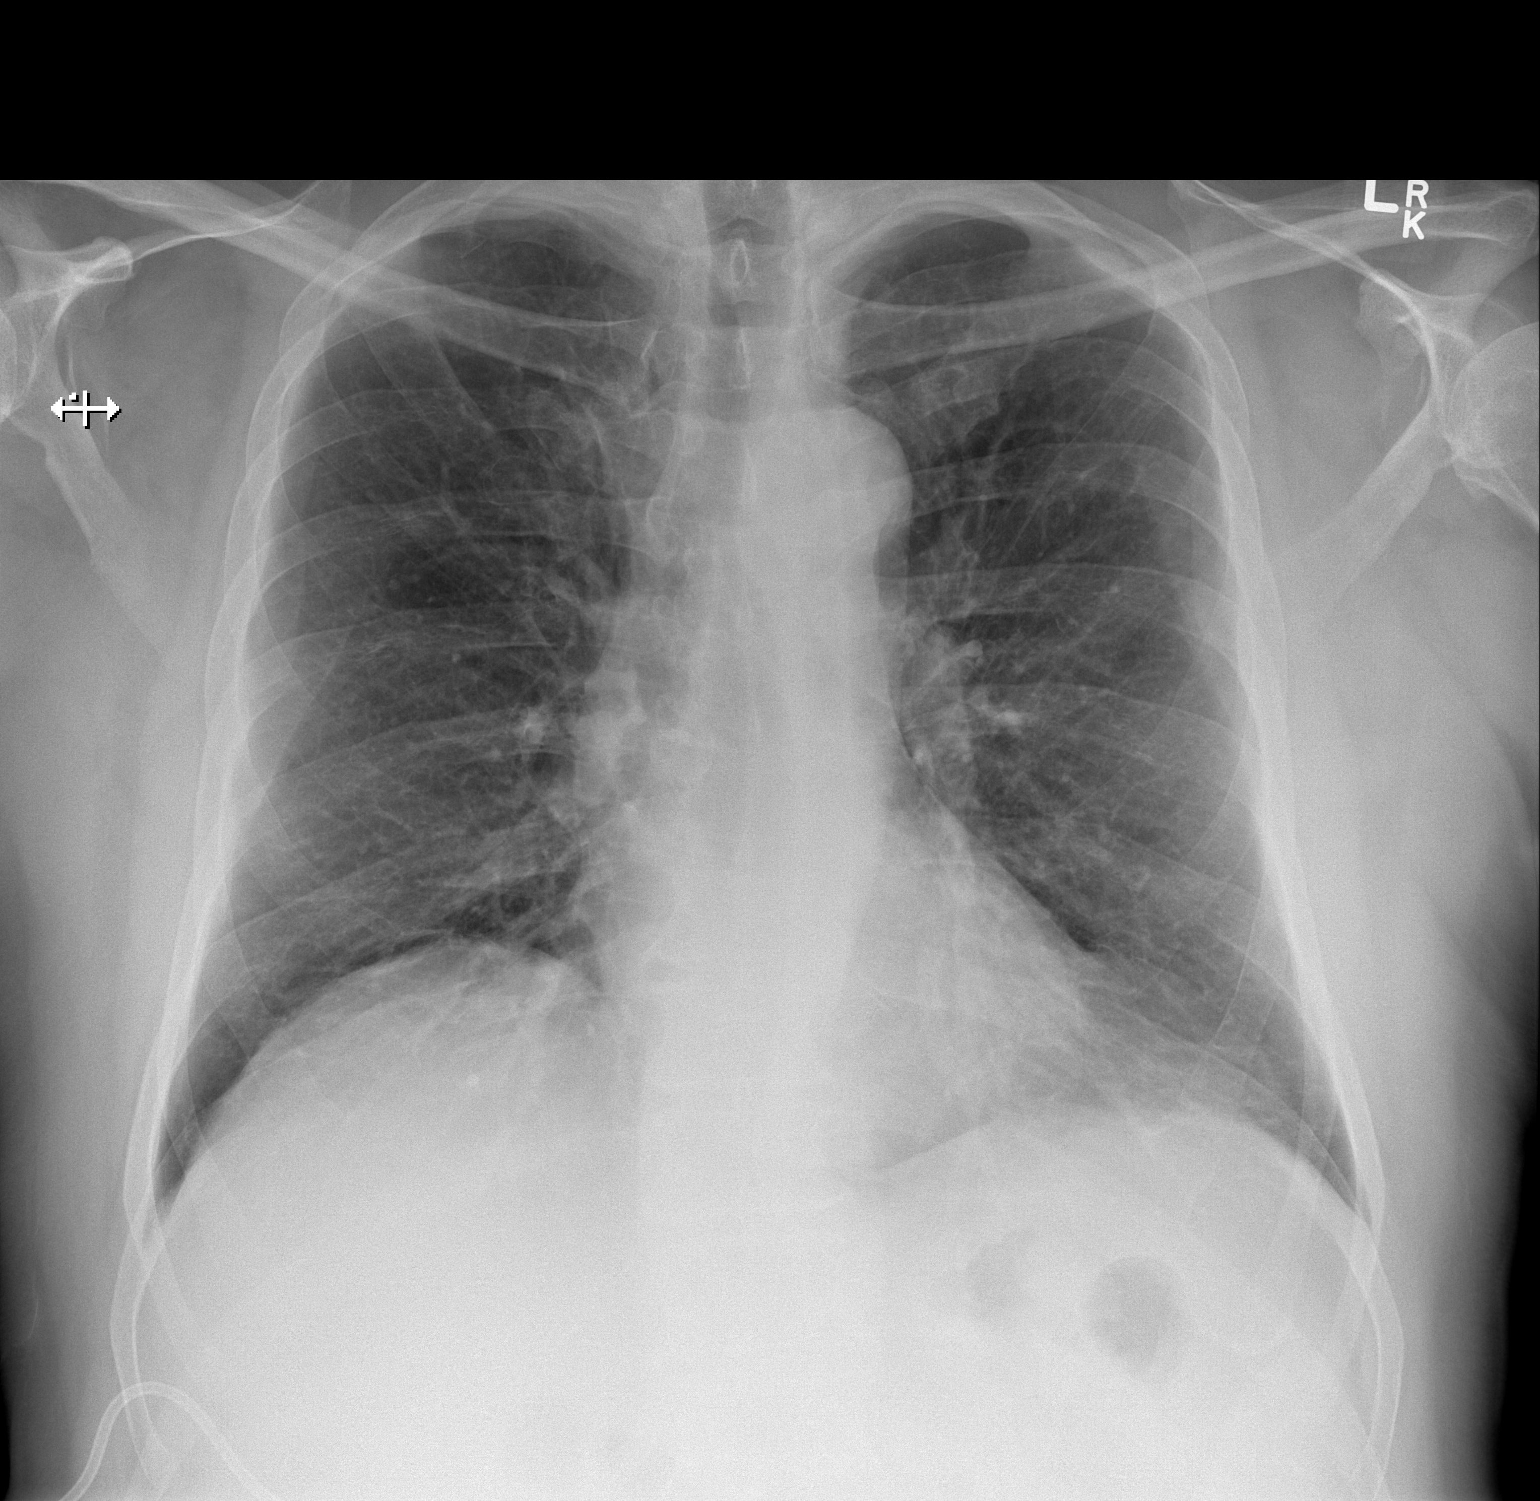

[w chest lat]
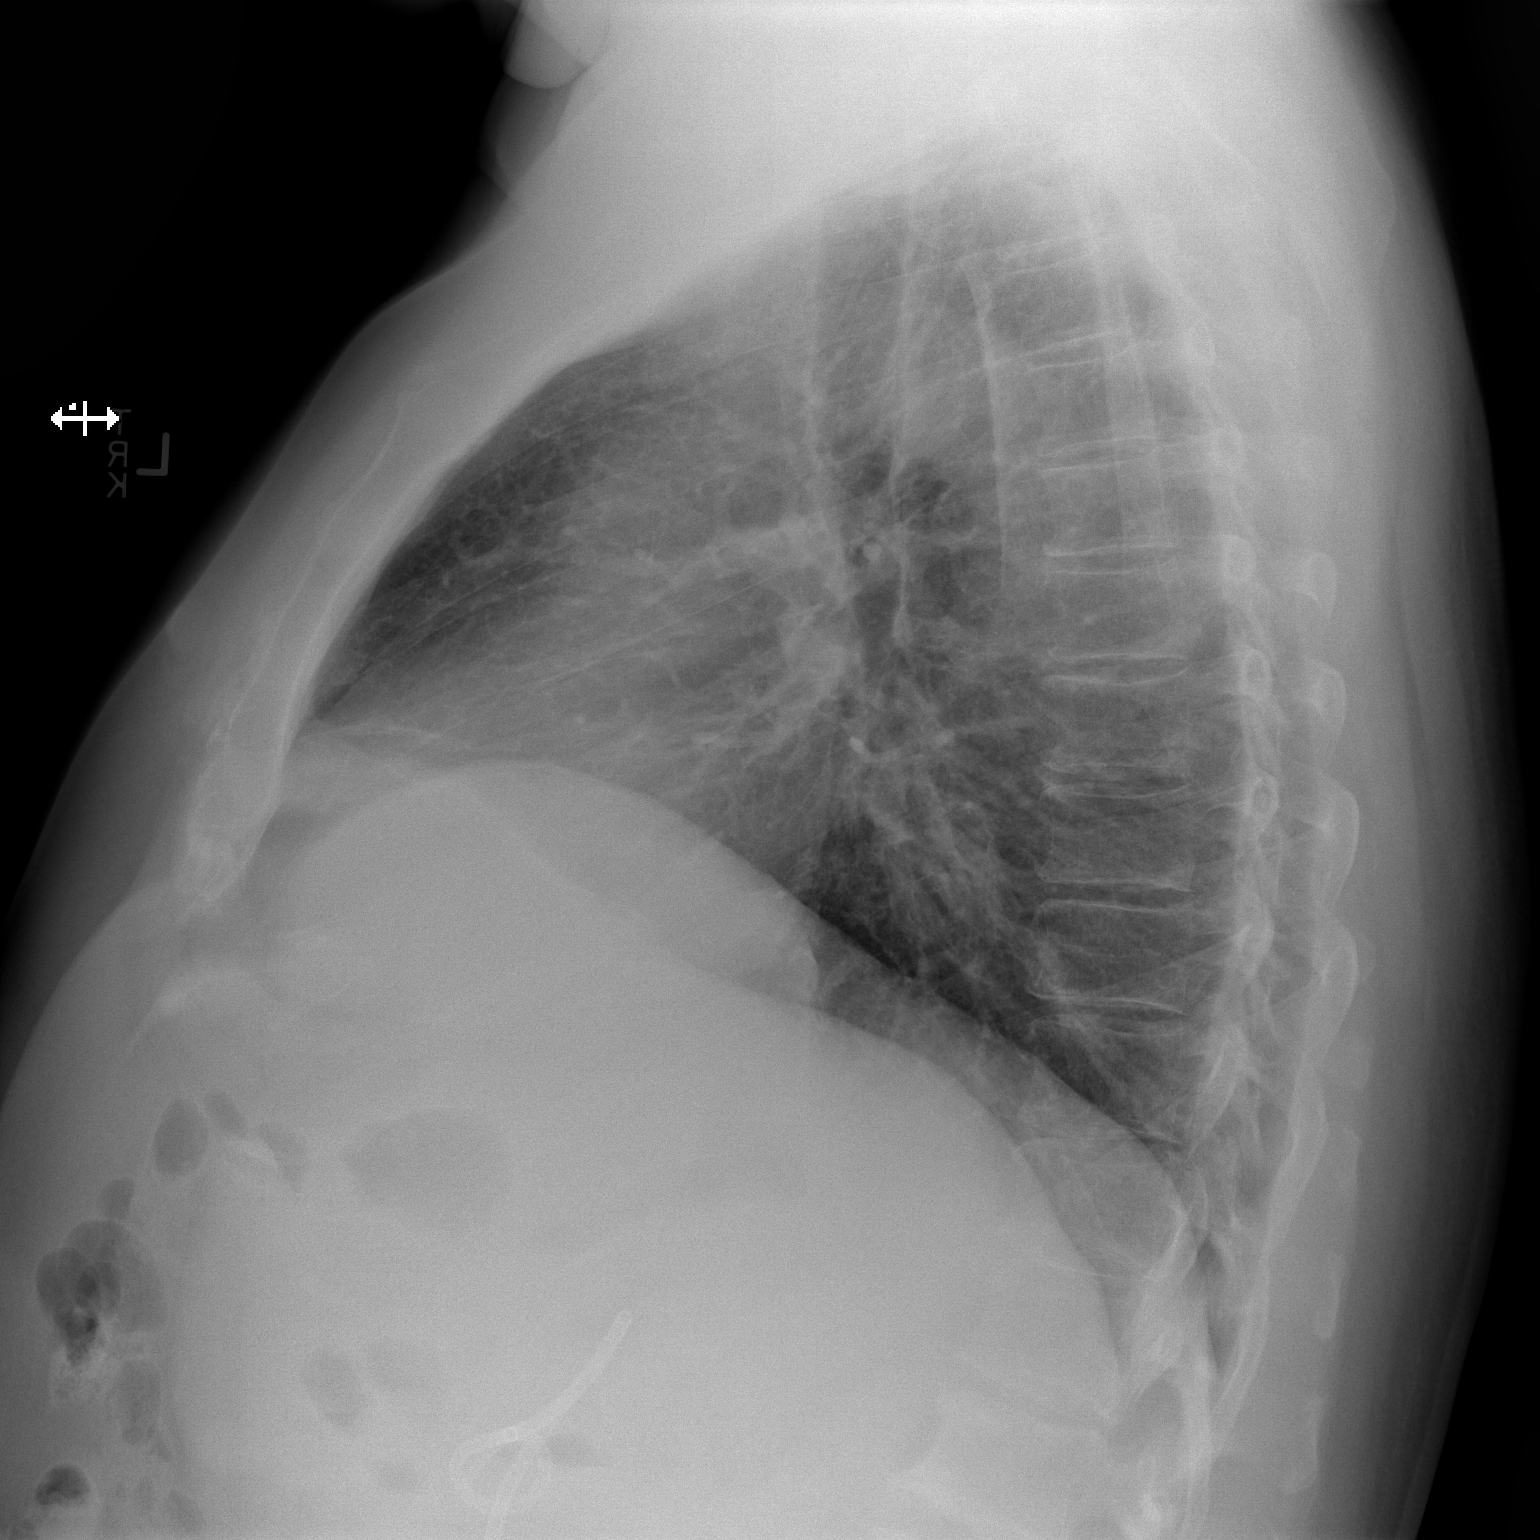

[2 of 2 positions shown; findings below may reference images not displayed]

FINDINGS: The heart size and mediastinal contours are within normal limits.
There has been decrease conspicuity of the mild bibasilar opacities.
No new focal regions consolidation or focal infiltrates.
Degenerative changes in the shoulders. Mild dextroscoliosis
thoracolumbar spine.
IMPRESSION: Minimal residual bibasilar atelectasis.  Lungs otherwise clear.

## 2015-06-22 DIAGNOSIS — I10 Essential (primary) hypertension: Secondary | ICD-10-CM | POA: Diagnosis not present

## 2015-06-22 DIAGNOSIS — R609 Edema, unspecified: Secondary | ICD-10-CM | POA: Diagnosis not present

## 2015-06-22 DIAGNOSIS — E1165 Type 2 diabetes mellitus with hyperglycemia: Secondary | ICD-10-CM | POA: Diagnosis not present

## 2015-06-22 DIAGNOSIS — E782 Mixed hyperlipidemia: Secondary | ICD-10-CM | POA: Diagnosis not present

## 2015-06-30 DIAGNOSIS — I10 Essential (primary) hypertension: Secondary | ICD-10-CM | POA: Diagnosis not present

## 2015-06-30 DIAGNOSIS — E782 Mixed hyperlipidemia: Secondary | ICD-10-CM | POA: Diagnosis not present

## 2015-06-30 DIAGNOSIS — E6609 Other obesity due to excess calories: Secondary | ICD-10-CM | POA: Diagnosis not present

## 2015-06-30 DIAGNOSIS — E1165 Type 2 diabetes mellitus with hyperglycemia: Secondary | ICD-10-CM | POA: Diagnosis not present

## 2015-07-10 DIAGNOSIS — M79672 Pain in left foot: Secondary | ICD-10-CM | POA: Diagnosis not present

## 2015-07-10 DIAGNOSIS — L89892 Pressure ulcer of other site, stage 2: Secondary | ICD-10-CM | POA: Diagnosis not present

## 2015-09-29 DIAGNOSIS — Z86010 Personal history of colonic polyps: Secondary | ICD-10-CM | POA: Diagnosis not present

## 2015-10-16 DIAGNOSIS — D124 Benign neoplasm of descending colon: Secondary | ICD-10-CM | POA: Diagnosis not present

## 2015-10-16 DIAGNOSIS — M79672 Pain in left foot: Secondary | ICD-10-CM | POA: Diagnosis not present

## 2015-10-16 DIAGNOSIS — I10 Essential (primary) hypertension: Secondary | ICD-10-CM | POA: Diagnosis not present

## 2015-10-16 DIAGNOSIS — K635 Polyp of colon: Secondary | ICD-10-CM | POA: Diagnosis not present

## 2015-10-16 DIAGNOSIS — D126 Benign neoplasm of colon, unspecified: Secondary | ICD-10-CM | POA: Diagnosis not present

## 2015-10-16 DIAGNOSIS — D125 Benign neoplasm of sigmoid colon: Secondary | ICD-10-CM | POA: Diagnosis not present

## 2015-10-16 DIAGNOSIS — L89892 Pressure ulcer of other site, stage 2: Secondary | ICD-10-CM | POA: Diagnosis not present

## 2015-10-16 DIAGNOSIS — E785 Hyperlipidemia, unspecified: Secondary | ICD-10-CM | POA: Diagnosis not present

## 2015-10-16 DIAGNOSIS — D123 Benign neoplasm of transverse colon: Secondary | ICD-10-CM | POA: Diagnosis not present

## 2015-10-16 DIAGNOSIS — Z86010 Personal history of colonic polyps: Secondary | ICD-10-CM | POA: Diagnosis not present

## 2016-02-12 DIAGNOSIS — M79672 Pain in left foot: Secondary | ICD-10-CM | POA: Diagnosis not present

## 2016-02-12 DIAGNOSIS — L89892 Pressure ulcer of other site, stage 2: Secondary | ICD-10-CM | POA: Diagnosis not present

## 2016-03-18 DIAGNOSIS — Z0001 Encounter for general adult medical examination with abnormal findings: Secondary | ICD-10-CM | POA: Diagnosis not present

## 2016-03-22 DIAGNOSIS — E1165 Type 2 diabetes mellitus with hyperglycemia: Secondary | ICD-10-CM | POA: Diagnosis not present

## 2016-03-22 DIAGNOSIS — I10 Essential (primary) hypertension: Secondary | ICD-10-CM | POA: Diagnosis not present

## 2016-03-22 DIAGNOSIS — E6609 Other obesity due to excess calories: Secondary | ICD-10-CM | POA: Diagnosis not present

## 2016-03-22 DIAGNOSIS — E782 Mixed hyperlipidemia: Secondary | ICD-10-CM | POA: Diagnosis not present

## 2016-03-22 DIAGNOSIS — Z0001 Encounter for general adult medical examination with abnormal findings: Secondary | ICD-10-CM | POA: Diagnosis not present

## 2016-07-12 DIAGNOSIS — R5383 Other fatigue: Secondary | ICD-10-CM | POA: Diagnosis not present

## 2016-07-12 DIAGNOSIS — E782 Mixed hyperlipidemia: Secondary | ICD-10-CM | POA: Diagnosis not present

## 2016-07-12 DIAGNOSIS — E1165 Type 2 diabetes mellitus with hyperglycemia: Secondary | ICD-10-CM | POA: Diagnosis not present

## 2016-07-12 DIAGNOSIS — I10 Essential (primary) hypertension: Secondary | ICD-10-CM | POA: Diagnosis not present

## 2016-07-19 DIAGNOSIS — I10 Essential (primary) hypertension: Secondary | ICD-10-CM | POA: Diagnosis not present

## 2016-07-19 DIAGNOSIS — E782 Mixed hyperlipidemia: Secondary | ICD-10-CM | POA: Diagnosis not present

## 2016-07-19 DIAGNOSIS — E6609 Other obesity due to excess calories: Secondary | ICD-10-CM | POA: Diagnosis not present

## 2016-07-19 DIAGNOSIS — E1165 Type 2 diabetes mellitus with hyperglycemia: Secondary | ICD-10-CM | POA: Diagnosis not present

## 2016-10-07 DIAGNOSIS — L89892 Pressure ulcer of other site, stage 2: Secondary | ICD-10-CM | POA: Diagnosis not present

## 2016-10-07 DIAGNOSIS — M79672 Pain in left foot: Secondary | ICD-10-CM | POA: Diagnosis not present

## 2016-10-24 DIAGNOSIS — E119 Type 2 diabetes mellitus without complications: Secondary | ICD-10-CM | POA: Diagnosis not present

## 2016-10-24 DIAGNOSIS — H2512 Age-related nuclear cataract, left eye: Secondary | ICD-10-CM | POA: Diagnosis not present

## 2016-10-24 DIAGNOSIS — H35032 Hypertensive retinopathy, left eye: Secondary | ICD-10-CM | POA: Diagnosis not present

## 2016-10-24 DIAGNOSIS — H25013 Cortical age-related cataract, bilateral: Secondary | ICD-10-CM | POA: Diagnosis not present

## 2016-10-24 DIAGNOSIS — H35031 Hypertensive retinopathy, right eye: Secondary | ICD-10-CM | POA: Diagnosis not present

## 2016-10-24 DIAGNOSIS — H25012 Cortical age-related cataract, left eye: Secondary | ICD-10-CM | POA: Diagnosis not present

## 2016-10-24 DIAGNOSIS — H2513 Age-related nuclear cataract, bilateral: Secondary | ICD-10-CM | POA: Diagnosis not present

## 2016-10-24 DIAGNOSIS — H35033 Hypertensive retinopathy, bilateral: Secondary | ICD-10-CM | POA: Diagnosis not present

## 2016-11-05 DIAGNOSIS — H2512 Age-related nuclear cataract, left eye: Secondary | ICD-10-CM | POA: Diagnosis not present

## 2016-11-05 DIAGNOSIS — H25812 Combined forms of age-related cataract, left eye: Secondary | ICD-10-CM | POA: Diagnosis not present

## 2016-11-22 DIAGNOSIS — E782 Mixed hyperlipidemia: Secondary | ICD-10-CM | POA: Diagnosis not present

## 2016-11-22 DIAGNOSIS — Z9189 Other specified personal risk factors, not elsewhere classified: Secondary | ICD-10-CM | POA: Diagnosis not present

## 2016-11-22 DIAGNOSIS — I10 Essential (primary) hypertension: Secondary | ICD-10-CM | POA: Diagnosis not present

## 2016-11-22 DIAGNOSIS — E1165 Type 2 diabetes mellitus with hyperglycemia: Secondary | ICD-10-CM | POA: Diagnosis not present

## 2016-11-29 DIAGNOSIS — E1165 Type 2 diabetes mellitus with hyperglycemia: Secondary | ICD-10-CM | POA: Diagnosis not present

## 2016-11-29 DIAGNOSIS — I10 Essential (primary) hypertension: Secondary | ICD-10-CM | POA: Diagnosis not present

## 2016-11-29 DIAGNOSIS — E6609 Other obesity due to excess calories: Secondary | ICD-10-CM | POA: Diagnosis not present

## 2016-11-29 DIAGNOSIS — E782 Mixed hyperlipidemia: Secondary | ICD-10-CM | POA: Diagnosis not present

## 2016-12-06 DIAGNOSIS — H2511 Age-related nuclear cataract, right eye: Secondary | ICD-10-CM | POA: Diagnosis not present

## 2016-12-06 DIAGNOSIS — H25011 Cortical age-related cataract, right eye: Secondary | ICD-10-CM | POA: Diagnosis not present

## 2016-12-17 DIAGNOSIS — H268 Other specified cataract: Secondary | ICD-10-CM | POA: Diagnosis not present

## 2016-12-17 DIAGNOSIS — H2511 Age-related nuclear cataract, right eye: Secondary | ICD-10-CM | POA: Diagnosis not present

## 2017-02-24 DIAGNOSIS — Z6835 Body mass index (BMI) 35.0-35.9, adult: Secondary | ICD-10-CM | POA: Diagnosis not present

## 2017-02-24 DIAGNOSIS — R1031 Right lower quadrant pain: Secondary | ICD-10-CM | POA: Diagnosis not present

## 2017-03-21 DIAGNOSIS — I10 Essential (primary) hypertension: Secondary | ICD-10-CM | POA: Diagnosis not present

## 2017-03-21 DIAGNOSIS — K219 Gastro-esophageal reflux disease without esophagitis: Secondary | ICD-10-CM | POA: Diagnosis not present

## 2017-03-21 DIAGNOSIS — E1165 Type 2 diabetes mellitus with hyperglycemia: Secondary | ICD-10-CM | POA: Diagnosis not present

## 2017-03-21 DIAGNOSIS — E782 Mixed hyperlipidemia: Secondary | ICD-10-CM | POA: Diagnosis not present

## 2017-03-28 DIAGNOSIS — I10 Essential (primary) hypertension: Secondary | ICD-10-CM | POA: Diagnosis not present

## 2017-03-28 DIAGNOSIS — E1165 Type 2 diabetes mellitus with hyperglycemia: Secondary | ICD-10-CM | POA: Diagnosis not present

## 2017-03-28 DIAGNOSIS — Z0001 Encounter for general adult medical examination with abnormal findings: Secondary | ICD-10-CM | POA: Diagnosis not present

## 2017-03-28 DIAGNOSIS — E6609 Other obesity due to excess calories: Secondary | ICD-10-CM | POA: Diagnosis not present

## 2017-03-28 DIAGNOSIS — Z1212 Encounter for screening for malignant neoplasm of rectum: Secondary | ICD-10-CM | POA: Diagnosis not present

## 2017-03-28 DIAGNOSIS — E782 Mixed hyperlipidemia: Secondary | ICD-10-CM | POA: Diagnosis not present

## 2017-07-25 DIAGNOSIS — I10 Essential (primary) hypertension: Secondary | ICD-10-CM | POA: Diagnosis not present

## 2017-07-25 DIAGNOSIS — E1165 Type 2 diabetes mellitus with hyperglycemia: Secondary | ICD-10-CM | POA: Diagnosis not present

## 2017-07-25 DIAGNOSIS — E782 Mixed hyperlipidemia: Secondary | ICD-10-CM | POA: Diagnosis not present

## 2017-07-25 DIAGNOSIS — E6609 Other obesity due to excess calories: Secondary | ICD-10-CM | POA: Diagnosis not present

## 2017-09-19 DIAGNOSIS — H35033 Hypertensive retinopathy, bilateral: Secondary | ICD-10-CM | POA: Diagnosis not present

## 2017-10-31 DIAGNOSIS — Z6835 Body mass index (BMI) 35.0-35.9, adult: Secondary | ICD-10-CM | POA: Diagnosis not present

## 2017-10-31 DIAGNOSIS — L209 Atopic dermatitis, unspecified: Secondary | ICD-10-CM | POA: Diagnosis not present

## 2017-11-21 DIAGNOSIS — E782 Mixed hyperlipidemia: Secondary | ICD-10-CM | POA: Diagnosis not present

## 2017-11-21 DIAGNOSIS — R5383 Other fatigue: Secondary | ICD-10-CM | POA: Diagnosis not present

## 2017-11-21 DIAGNOSIS — E1165 Type 2 diabetes mellitus with hyperglycemia: Secondary | ICD-10-CM | POA: Diagnosis not present

## 2017-11-21 DIAGNOSIS — Z9189 Other specified personal risk factors, not elsewhere classified: Secondary | ICD-10-CM | POA: Diagnosis not present

## 2017-11-21 DIAGNOSIS — K219 Gastro-esophageal reflux disease without esophagitis: Secondary | ICD-10-CM | POA: Diagnosis not present

## 2017-11-21 DIAGNOSIS — I10 Essential (primary) hypertension: Secondary | ICD-10-CM | POA: Diagnosis not present

## 2017-11-28 DIAGNOSIS — I10 Essential (primary) hypertension: Secondary | ICD-10-CM | POA: Diagnosis not present

## 2017-11-28 DIAGNOSIS — E782 Mixed hyperlipidemia: Secondary | ICD-10-CM | POA: Diagnosis not present

## 2017-11-28 DIAGNOSIS — E6609 Other obesity due to excess calories: Secondary | ICD-10-CM | POA: Diagnosis not present

## 2017-11-28 DIAGNOSIS — E1165 Type 2 diabetes mellitus with hyperglycemia: Secondary | ICD-10-CM | POA: Diagnosis not present

## 2018-03-28 DIAGNOSIS — S61208A Unspecified open wound of other finger without damage to nail, initial encounter: Secondary | ICD-10-CM | POA: Diagnosis not present

## 2018-03-28 DIAGNOSIS — Z6835 Body mass index (BMI) 35.0-35.9, adult: Secondary | ICD-10-CM | POA: Diagnosis not present

## 2018-04-10 DIAGNOSIS — I10 Essential (primary) hypertension: Secondary | ICD-10-CM | POA: Diagnosis not present

## 2018-04-10 DIAGNOSIS — N400 Enlarged prostate without lower urinary tract symptoms: Secondary | ICD-10-CM | POA: Diagnosis not present

## 2018-04-10 DIAGNOSIS — R5383 Other fatigue: Secondary | ICD-10-CM | POA: Diagnosis not present

## 2018-04-10 DIAGNOSIS — K219 Gastro-esophageal reflux disease without esophagitis: Secondary | ICD-10-CM | POA: Diagnosis not present

## 2018-04-10 DIAGNOSIS — E782 Mixed hyperlipidemia: Secondary | ICD-10-CM | POA: Diagnosis not present

## 2018-04-10 DIAGNOSIS — E1165 Type 2 diabetes mellitus with hyperglycemia: Secondary | ICD-10-CM | POA: Diagnosis not present

## 2018-04-17 DIAGNOSIS — E1165 Type 2 diabetes mellitus with hyperglycemia: Secondary | ICD-10-CM | POA: Diagnosis not present

## 2018-04-17 DIAGNOSIS — I83812 Varicose veins of left lower extremities with pain: Secondary | ICD-10-CM | POA: Diagnosis not present

## 2018-04-17 DIAGNOSIS — I10 Essential (primary) hypertension: Secondary | ICD-10-CM | POA: Diagnosis not present

## 2018-04-17 DIAGNOSIS — Z0001 Encounter for general adult medical examination with abnormal findings: Secondary | ICD-10-CM | POA: Diagnosis not present

## 2018-04-17 DIAGNOSIS — Z1212 Encounter for screening for malignant neoplasm of rectum: Secondary | ICD-10-CM | POA: Diagnosis not present

## 2018-08-07 DIAGNOSIS — E782 Mixed hyperlipidemia: Secondary | ICD-10-CM | POA: Diagnosis not present

## 2018-08-07 DIAGNOSIS — E1165 Type 2 diabetes mellitus with hyperglycemia: Secondary | ICD-10-CM | POA: Diagnosis not present

## 2018-08-07 DIAGNOSIS — K219 Gastro-esophageal reflux disease without esophagitis: Secondary | ICD-10-CM | POA: Diagnosis not present

## 2018-08-07 DIAGNOSIS — I10 Essential (primary) hypertension: Secondary | ICD-10-CM | POA: Diagnosis not present

## 2018-08-07 DIAGNOSIS — R5383 Other fatigue: Secondary | ICD-10-CM | POA: Diagnosis not present

## 2018-08-14 DIAGNOSIS — I10 Essential (primary) hypertension: Secondary | ICD-10-CM | POA: Diagnosis not present

## 2018-08-14 DIAGNOSIS — E782 Mixed hyperlipidemia: Secondary | ICD-10-CM | POA: Diagnosis not present

## 2018-08-14 DIAGNOSIS — Z23 Encounter for immunization: Secondary | ICD-10-CM | POA: Diagnosis not present

## 2018-08-14 DIAGNOSIS — E1165 Type 2 diabetes mellitus with hyperglycemia: Secondary | ICD-10-CM | POA: Diagnosis not present

## 2018-08-14 DIAGNOSIS — I83812 Varicose veins of left lower extremities with pain: Secondary | ICD-10-CM | POA: Diagnosis not present

## 2018-11-11 DIAGNOSIS — M545 Low back pain: Secondary | ICD-10-CM | POA: Diagnosis not present

## 2018-11-11 DIAGNOSIS — Z6834 Body mass index (BMI) 34.0-34.9, adult: Secondary | ICD-10-CM | POA: Diagnosis not present

## 2018-12-11 DIAGNOSIS — E782 Mixed hyperlipidemia: Secondary | ICD-10-CM | POA: Diagnosis not present

## 2018-12-11 DIAGNOSIS — K219 Gastro-esophageal reflux disease without esophagitis: Secondary | ICD-10-CM | POA: Diagnosis not present

## 2018-12-11 DIAGNOSIS — I10 Essential (primary) hypertension: Secondary | ICD-10-CM | POA: Diagnosis not present

## 2018-12-11 DIAGNOSIS — E1165 Type 2 diabetes mellitus with hyperglycemia: Secondary | ICD-10-CM | POA: Diagnosis not present

## 2018-12-25 DIAGNOSIS — Z23 Encounter for immunization: Secondary | ICD-10-CM | POA: Diagnosis not present

## 2018-12-25 DIAGNOSIS — I83812 Varicose veins of left lower extremities with pain: Secondary | ICD-10-CM | POA: Diagnosis not present

## 2018-12-25 DIAGNOSIS — E782 Mixed hyperlipidemia: Secondary | ICD-10-CM | POA: Diagnosis not present

## 2018-12-25 DIAGNOSIS — E1165 Type 2 diabetes mellitus with hyperglycemia: Secondary | ICD-10-CM | POA: Diagnosis not present

## 2018-12-25 DIAGNOSIS — I10 Essential (primary) hypertension: Secondary | ICD-10-CM | POA: Diagnosis not present

## 2019-04-09 DIAGNOSIS — Z0001 Encounter for general adult medical examination with abnormal findings: Secondary | ICD-10-CM | POA: Diagnosis not present

## 2019-04-16 DIAGNOSIS — R972 Elevated prostate specific antigen [PSA]: Secondary | ICD-10-CM | POA: Diagnosis not present

## 2019-04-16 DIAGNOSIS — Z23 Encounter for immunization: Secondary | ICD-10-CM | POA: Diagnosis not present

## 2019-04-16 DIAGNOSIS — Z0001 Encounter for general adult medical examination with abnormal findings: Secondary | ICD-10-CM | POA: Diagnosis not present

## 2019-04-16 DIAGNOSIS — H43813 Vitreous degeneration, bilateral: Secondary | ICD-10-CM | POA: Diagnosis not present

## 2019-04-16 DIAGNOSIS — E1165 Type 2 diabetes mellitus with hyperglycemia: Secondary | ICD-10-CM | POA: Diagnosis not present

## 2019-04-16 DIAGNOSIS — I10 Essential (primary) hypertension: Secondary | ICD-10-CM | POA: Diagnosis not present

## 2019-04-16 DIAGNOSIS — N400 Enlarged prostate without lower urinary tract symptoms: Secondary | ICD-10-CM | POA: Diagnosis not present

## 2019-05-07 DIAGNOSIS — R972 Elevated prostate specific antigen [PSA]: Secondary | ICD-10-CM | POA: Diagnosis not present

## 2019-07-19 DIAGNOSIS — Z6832 Body mass index (BMI) 32.0-32.9, adult: Secondary | ICD-10-CM | POA: Diagnosis not present

## 2019-07-19 DIAGNOSIS — K921 Melena: Secondary | ICD-10-CM | POA: Diagnosis not present

## 2019-07-23 DIAGNOSIS — R945 Abnormal results of liver function studies: Secondary | ICD-10-CM | POA: Diagnosis not present

## 2019-08-03 DIAGNOSIS — N281 Cyst of kidney, acquired: Secondary | ICD-10-CM | POA: Diagnosis not present

## 2019-08-03 DIAGNOSIS — R7989 Other specified abnormal findings of blood chemistry: Secondary | ICD-10-CM | POA: Diagnosis not present

## 2019-08-03 DIAGNOSIS — R162 Hepatomegaly with splenomegaly, not elsewhere classified: Secondary | ICD-10-CM | POA: Diagnosis not present

## 2019-08-03 DIAGNOSIS — Z9049 Acquired absence of other specified parts of digestive tract: Secondary | ICD-10-CM | POA: Diagnosis not present

## 2019-08-06 DIAGNOSIS — E782 Mixed hyperlipidemia: Secondary | ICD-10-CM | POA: Diagnosis not present

## 2019-08-06 DIAGNOSIS — E1165 Type 2 diabetes mellitus with hyperglycemia: Secondary | ICD-10-CM | POA: Diagnosis not present

## 2019-08-06 DIAGNOSIS — I10 Essential (primary) hypertension: Secondary | ICD-10-CM | POA: Diagnosis not present

## 2019-08-06 DIAGNOSIS — E8881 Metabolic syndrome: Secondary | ICD-10-CM | POA: Diagnosis not present

## 2019-08-06 DIAGNOSIS — K769 Liver disease, unspecified: Secondary | ICD-10-CM | POA: Diagnosis not present

## 2019-08-06 DIAGNOSIS — K219 Gastro-esophageal reflux disease without esophagitis: Secondary | ICD-10-CM | POA: Diagnosis not present

## 2019-08-13 DIAGNOSIS — I10 Essential (primary) hypertension: Secondary | ICD-10-CM | POA: Diagnosis not present

## 2019-08-13 DIAGNOSIS — N400 Enlarged prostate without lower urinary tract symptoms: Secondary | ICD-10-CM | POA: Diagnosis not present

## 2019-08-13 DIAGNOSIS — E1165 Type 2 diabetes mellitus with hyperglycemia: Secondary | ICD-10-CM | POA: Diagnosis not present

## 2019-08-13 DIAGNOSIS — I83812 Varicose veins of left lower extremities with pain: Secondary | ICD-10-CM | POA: Diagnosis not present

## 2019-08-24 DIAGNOSIS — I10 Essential (primary) hypertension: Secondary | ICD-10-CM | POA: Diagnosis not present

## 2019-08-31 DIAGNOSIS — Z9049 Acquired absence of other specified parts of digestive tract: Secondary | ICD-10-CM | POA: Diagnosis not present

## 2019-08-31 DIAGNOSIS — K769 Liver disease, unspecified: Secondary | ICD-10-CM | POA: Diagnosis not present

## 2019-08-31 DIAGNOSIS — K7689 Other specified diseases of liver: Secondary | ICD-10-CM | POA: Diagnosis not present

## 2019-08-31 DIAGNOSIS — R162 Hepatomegaly with splenomegaly, not elsewhere classified: Secondary | ICD-10-CM | POA: Diagnosis not present

## 2019-08-31 DIAGNOSIS — R161 Splenomegaly, not elsewhere classified: Secondary | ICD-10-CM | POA: Diagnosis not present

## 2019-09-01 DIAGNOSIS — R160 Hepatomegaly, not elsewhere classified: Secondary | ICD-10-CM | POA: Diagnosis not present

## 2019-09-06 DIAGNOSIS — Z6832 Body mass index (BMI) 32.0-32.9, adult: Secondary | ICD-10-CM | POA: Diagnosis not present

## 2019-09-06 DIAGNOSIS — R634 Abnormal weight loss: Secondary | ICD-10-CM | POA: Diagnosis not present

## 2019-09-06 DIAGNOSIS — R040 Epistaxis: Secondary | ICD-10-CM | POA: Diagnosis not present

## 2019-09-06 DIAGNOSIS — R160 Hepatomegaly, not elsewhere classified: Secondary | ICD-10-CM | POA: Diagnosis not present

## 2019-09-07 DIAGNOSIS — K769 Liver disease, unspecified: Secondary | ICD-10-CM | POA: Diagnosis not present

## 2019-09-07 DIAGNOSIS — C787 Secondary malignant neoplasm of liver and intrahepatic bile duct: Secondary | ICD-10-CM | POA: Diagnosis not present

## 2019-09-07 DIAGNOSIS — R160 Hepatomegaly, not elsewhere classified: Secondary | ICD-10-CM | POA: Diagnosis not present

## 2019-09-20 DIAGNOSIS — I700 Atherosclerosis of aorta: Secondary | ICD-10-CM | POA: Diagnosis not present

## 2019-09-20 DIAGNOSIS — R634 Abnormal weight loss: Secondary | ICD-10-CM | POA: Diagnosis not present

## 2019-09-20 DIAGNOSIS — C787 Secondary malignant neoplasm of liver and intrahepatic bile duct: Secondary | ICD-10-CM | POA: Diagnosis not present

## 2019-09-20 DIAGNOSIS — K6389 Other specified diseases of intestine: Secondary | ICD-10-CM | POA: Diagnosis not present

## 2019-09-20 DIAGNOSIS — N289 Disorder of kidney and ureter, unspecified: Secondary | ICD-10-CM | POA: Diagnosis not present

## 2019-09-20 DIAGNOSIS — R040 Epistaxis: Secondary | ICD-10-CM | POA: Diagnosis not present

## 2019-09-21 DIAGNOSIS — Z6832 Body mass index (BMI) 32.0-32.9, adult: Secondary | ICD-10-CM | POA: Diagnosis not present

## 2019-09-21 DIAGNOSIS — E119 Type 2 diabetes mellitus without complications: Secondary | ICD-10-CM | POA: Diagnosis not present

## 2019-09-21 DIAGNOSIS — I10 Essential (primary) hypertension: Secondary | ICD-10-CM | POA: Diagnosis not present

## 2019-09-21 DIAGNOSIS — R162 Hepatomegaly with splenomegaly, not elsewhere classified: Secondary | ICD-10-CM | POA: Diagnosis not present

## 2019-09-21 DIAGNOSIS — C221 Intrahepatic bile duct carcinoma: Secondary | ICD-10-CM | POA: Diagnosis not present

## 2019-09-21 DIAGNOSIS — Z7189 Other specified counseling: Secondary | ICD-10-CM | POA: Diagnosis not present

## 2019-09-21 DIAGNOSIS — N289 Disorder of kidney and ureter, unspecified: Secondary | ICD-10-CM | POA: Diagnosis not present

## 2019-09-21 DIAGNOSIS — N400 Enlarged prostate without lower urinary tract symptoms: Secondary | ICD-10-CM | POA: Diagnosis not present

## 2019-09-21 DIAGNOSIS — K769 Liver disease, unspecified: Secondary | ICD-10-CM | POA: Diagnosis not present

## 2019-09-21 DIAGNOSIS — R64 Cachexia: Secondary | ICD-10-CM | POA: Diagnosis not present

## 2019-09-21 DIAGNOSIS — Z9049 Acquired absence of other specified parts of digestive tract: Secondary | ICD-10-CM | POA: Diagnosis not present

## 2019-09-21 DIAGNOSIS — Z7984 Long term (current) use of oral hypoglycemic drugs: Secondary | ICD-10-CM | POA: Diagnosis not present

## 2019-09-21 DIAGNOSIS — E785 Hyperlipidemia, unspecified: Secondary | ICD-10-CM | POA: Diagnosis not present

## 2019-09-21 DIAGNOSIS — R1013 Epigastric pain: Secondary | ICD-10-CM | POA: Diagnosis not present

## 2019-09-21 DIAGNOSIS — R6881 Early satiety: Secondary | ICD-10-CM | POA: Diagnosis not present

## 2019-09-29 DIAGNOSIS — C229 Malignant neoplasm of liver, not specified as primary or secondary: Secondary | ICD-10-CM | POA: Diagnosis not present

## 2019-09-29 DIAGNOSIS — Z6831 Body mass index (BMI) 31.0-31.9, adult: Secondary | ICD-10-CM | POA: Diagnosis not present

## 2019-10-05 DIAGNOSIS — R000 Tachycardia, unspecified: Secondary | ICD-10-CM | POA: Diagnosis not present

## 2019-10-05 DIAGNOSIS — E785 Hyperlipidemia, unspecified: Secondary | ICD-10-CM | POA: Diagnosis not present

## 2019-10-05 DIAGNOSIS — J9811 Atelectasis: Secondary | ICD-10-CM | POA: Diagnosis not present

## 2019-10-05 DIAGNOSIS — Z7984 Long term (current) use of oral hypoglycemic drugs: Secondary | ICD-10-CM | POA: Diagnosis not present

## 2019-10-05 DIAGNOSIS — Z79899 Other long term (current) drug therapy: Secondary | ICD-10-CM | POA: Diagnosis not present

## 2019-10-05 DIAGNOSIS — Z9049 Acquired absence of other specified parts of digestive tract: Secondary | ICD-10-CM | POA: Diagnosis not present

## 2019-10-05 DIAGNOSIS — Z87891 Personal history of nicotine dependence: Secondary | ICD-10-CM | POA: Diagnosis not present

## 2019-10-05 DIAGNOSIS — E119 Type 2 diabetes mellitus without complications: Secondary | ICD-10-CM | POA: Diagnosis not present

## 2019-10-05 DIAGNOSIS — C221 Intrahepatic bile duct carcinoma: Secondary | ICD-10-CM | POA: Diagnosis not present

## 2019-10-05 DIAGNOSIS — C229 Malignant neoplasm of liver, not specified as primary or secondary: Secondary | ICD-10-CM | POA: Diagnosis not present

## 2019-10-05 DIAGNOSIS — I10 Essential (primary) hypertension: Secondary | ICD-10-CM | POA: Diagnosis not present

## 2019-10-05 DIAGNOSIS — K219 Gastro-esophageal reflux disease without esophagitis: Secondary | ICD-10-CM | POA: Diagnosis not present

## 2019-10-07 DIAGNOSIS — C229 Malignant neoplasm of liver, not specified as primary or secondary: Secondary | ICD-10-CM | POA: Diagnosis not present

## 2019-10-07 DIAGNOSIS — Z86010 Personal history of colonic polyps: Secondary | ICD-10-CM | POA: Diagnosis not present

## 2019-10-07 DIAGNOSIS — Z7189 Other specified counseling: Secondary | ICD-10-CM | POA: Diagnosis not present

## 2019-10-07 DIAGNOSIS — R64 Cachexia: Secondary | ICD-10-CM | POA: Diagnosis not present

## 2019-10-07 DIAGNOSIS — R1013 Epigastric pain: Secondary | ICD-10-CM | POA: Diagnosis not present

## 2019-10-07 DIAGNOSIS — F329 Major depressive disorder, single episode, unspecified: Secondary | ICD-10-CM | POA: Diagnosis not present

## 2019-10-07 DIAGNOSIS — Z9049 Acquired absence of other specified parts of digestive tract: Secondary | ICD-10-CM | POA: Diagnosis not present

## 2019-10-07 DIAGNOSIS — R6881 Early satiety: Secondary | ICD-10-CM | POA: Diagnosis not present

## 2019-10-07 DIAGNOSIS — E119 Type 2 diabetes mellitus without complications: Secondary | ICD-10-CM | POA: Diagnosis not present

## 2019-10-07 DIAGNOSIS — C221 Intrahepatic bile duct carcinoma: Secondary | ICD-10-CM | POA: Diagnosis not present

## 2019-10-07 DIAGNOSIS — N400 Enlarged prostate without lower urinary tract symptoms: Secondary | ICD-10-CM | POA: Diagnosis not present

## 2019-10-07 DIAGNOSIS — R162 Hepatomegaly with splenomegaly, not elsewhere classified: Secondary | ICD-10-CM | POA: Diagnosis not present

## 2019-10-07 DIAGNOSIS — Z9221 Personal history of antineoplastic chemotherapy: Secondary | ICD-10-CM | POA: Diagnosis not present

## 2019-10-07 DIAGNOSIS — I10 Essential (primary) hypertension: Secondary | ICD-10-CM | POA: Diagnosis not present

## 2019-10-07 DIAGNOSIS — K769 Liver disease, unspecified: Secondary | ICD-10-CM | POA: Diagnosis not present

## 2019-10-07 DIAGNOSIS — E785 Hyperlipidemia, unspecified: Secondary | ICD-10-CM | POA: Diagnosis not present

## 2019-10-12 DIAGNOSIS — C221 Intrahepatic bile duct carcinoma: Secondary | ICD-10-CM | POA: Diagnosis not present

## 2019-10-12 DIAGNOSIS — Z7189 Other specified counseling: Secondary | ICD-10-CM | POA: Diagnosis not present

## 2019-10-12 DIAGNOSIS — C229 Malignant neoplasm of liver, not specified as primary or secondary: Secondary | ICD-10-CM | POA: Diagnosis not present

## 2019-10-12 DIAGNOSIS — K831 Obstruction of bile duct: Secondary | ICD-10-CM | POA: Diagnosis not present

## 2019-10-12 DIAGNOSIS — Z6830 Body mass index (BMI) 30.0-30.9, adult: Secondary | ICD-10-CM | POA: Diagnosis not present

## 2019-10-17 DIAGNOSIS — C229 Malignant neoplasm of liver, not specified as primary or secondary: Secondary | ICD-10-CM | POA: Diagnosis not present

## 2019-10-17 DIAGNOSIS — C221 Intrahepatic bile duct carcinoma: Secondary | ICD-10-CM | POA: Diagnosis not present

## 2019-10-19 DIAGNOSIS — R64 Cachexia: Secondary | ICD-10-CM | POA: Diagnosis not present

## 2019-10-21 DIAGNOSIS — C221 Intrahepatic bile duct carcinoma: Secondary | ICD-10-CM | POA: Diagnosis not present

## 2019-10-21 DIAGNOSIS — C229 Malignant neoplasm of liver, not specified as primary or secondary: Secondary | ICD-10-CM | POA: Diagnosis not present

## 2019-10-22 DIAGNOSIS — C229 Malignant neoplasm of liver, not specified as primary or secondary: Secondary | ICD-10-CM | POA: Diagnosis not present

## 2019-10-22 DIAGNOSIS — C221 Intrahepatic bile duct carcinoma: Secondary | ICD-10-CM | POA: Diagnosis not present

## 2019-10-26 DIAGNOSIS — C221 Intrahepatic bile duct carcinoma: Secondary | ICD-10-CM | POA: Diagnosis not present

## 2019-10-26 DIAGNOSIS — C229 Malignant neoplasm of liver, not specified as primary or secondary: Secondary | ICD-10-CM | POA: Diagnosis not present

## 2019-10-27 DIAGNOSIS — Z09 Encounter for follow-up examination after completed treatment for conditions other than malignant neoplasm: Secondary | ICD-10-CM | POA: Diagnosis not present

## 2019-10-27 DIAGNOSIS — I82890 Acute embolism and thrombosis of other specified veins: Secondary | ICD-10-CM | POA: Diagnosis not present

## 2019-10-27 DIAGNOSIS — I82413 Acute embolism and thrombosis of femoral vein, bilateral: Secondary | ICD-10-CM | POA: Diagnosis not present

## 2019-10-27 DIAGNOSIS — I82442 Acute embolism and thrombosis of left tibial vein: Secondary | ICD-10-CM | POA: Diagnosis not present

## 2019-10-27 DIAGNOSIS — R609 Edema, unspecified: Secondary | ICD-10-CM | POA: Diagnosis not present

## 2019-10-27 DIAGNOSIS — I82432 Acute embolism and thrombosis of left popliteal vein: Secondary | ICD-10-CM | POA: Diagnosis not present

## 2019-10-27 DIAGNOSIS — I82412 Acute embolism and thrombosis of left femoral vein: Secondary | ICD-10-CM | POA: Diagnosis not present

## 2019-10-27 DIAGNOSIS — R64 Cachexia: Secondary | ICD-10-CM | POA: Diagnosis not present

## 2019-10-27 DIAGNOSIS — C229 Malignant neoplasm of liver, not specified as primary or secondary: Secondary | ICD-10-CM | POA: Diagnosis not present

## 2019-10-27 DIAGNOSIS — R600 Localized edema: Secondary | ICD-10-CM | POA: Diagnosis not present

## 2019-10-27 DIAGNOSIS — C221 Intrahepatic bile duct carcinoma: Secondary | ICD-10-CM | POA: Diagnosis not present

## 2019-10-27 DIAGNOSIS — I82813 Embolism and thrombosis of superficial veins of lower extremities, bilateral: Secondary | ICD-10-CM | POA: Diagnosis not present

## 2019-10-28 DIAGNOSIS — C221 Intrahepatic bile duct carcinoma: Secondary | ICD-10-CM | POA: Diagnosis not present

## 2019-10-28 DIAGNOSIS — Z5111 Encounter for antineoplastic chemotherapy: Secondary | ICD-10-CM | POA: Diagnosis not present

## 2019-10-28 DIAGNOSIS — Z95828 Presence of other vascular implants and grafts: Secondary | ICD-10-CM | POA: Diagnosis not present

## 2019-10-29 DIAGNOSIS — C229 Malignant neoplasm of liver, not specified as primary or secondary: Secondary | ICD-10-CM | POA: Diagnosis not present

## 2019-10-29 DIAGNOSIS — Z5111 Encounter for antineoplastic chemotherapy: Secondary | ICD-10-CM | POA: Diagnosis not present

## 2019-10-29 DIAGNOSIS — C221 Intrahepatic bile duct carcinoma: Secondary | ICD-10-CM | POA: Diagnosis not present

## 2019-11-01 DIAGNOSIS — Z87891 Personal history of nicotine dependence: Secondary | ICD-10-CM | POA: Diagnosis not present

## 2019-11-01 DIAGNOSIS — Z79899 Other long term (current) drug therapy: Secondary | ICD-10-CM | POA: Diagnosis not present

## 2019-11-01 DIAGNOSIS — E119 Type 2 diabetes mellitus without complications: Secondary | ICD-10-CM | POA: Diagnosis not present

## 2019-11-01 DIAGNOSIS — Z86718 Personal history of other venous thrombosis and embolism: Secondary | ICD-10-CM | POA: Diagnosis not present

## 2019-11-01 DIAGNOSIS — K219 Gastro-esophageal reflux disease without esophagitis: Secondary | ICD-10-CM | POA: Diagnosis not present

## 2019-11-01 DIAGNOSIS — C229 Malignant neoplasm of liver, not specified as primary or secondary: Secondary | ICD-10-CM | POA: Diagnosis not present

## 2019-11-01 DIAGNOSIS — Z7901 Long term (current) use of anticoagulants: Secondary | ICD-10-CM | POA: Diagnosis not present

## 2019-11-01 DIAGNOSIS — I469 Cardiac arrest, cause unspecified: Secondary | ICD-10-CM | POA: Diagnosis not present

## 2019-11-01 DIAGNOSIS — E785 Hyperlipidemia, unspecified: Secondary | ICD-10-CM | POA: Diagnosis not present

## 2019-11-01 DIAGNOSIS — I10 Essential (primary) hypertension: Secondary | ICD-10-CM | POA: Diagnosis not present

## 2019-11-10 DEATH — deceased
